# Patient Record
Sex: Male | Born: 1935 | Race: White | Hispanic: No | Marital: Single | Smoking: Never smoker
Health system: Southern US, Community
[De-identification: ages and names within clinical notes are randomized; demographics above are authoritative.]

## PROBLEM LIST (undated history)

## (undated) DIAGNOSIS — M069 Rheumatoid arthritis, unspecified: Secondary | ICD-10-CM

## (undated) DIAGNOSIS — J449 Chronic obstructive pulmonary disease, unspecified: Secondary | ICD-10-CM

---

## 2004-03-23 ENCOUNTER — Other Ambulatory Visit: Payer: Self-pay

## 2004-03-23 ENCOUNTER — Inpatient Hospital Stay: Payer: Self-pay | Admitting: Internal Medicine

## 2004-11-30 ENCOUNTER — Ambulatory Visit: Payer: Self-pay

## 2005-04-22 ENCOUNTER — Other Ambulatory Visit: Payer: Self-pay

## 2005-04-22 ENCOUNTER — Emergency Department: Payer: Self-pay | Admitting: Emergency Medicine

## 2005-07-12 ENCOUNTER — Ambulatory Visit: Payer: Self-pay | Admitting: Internal Medicine

## 2005-09-08 ENCOUNTER — Inpatient Hospital Stay: Payer: Self-pay | Admitting: Internal Medicine

## 2005-09-08 ENCOUNTER — Other Ambulatory Visit: Payer: Self-pay

## 2005-09-15 ENCOUNTER — Ambulatory Visit: Payer: Self-pay | Admitting: Specialist

## 2005-10-31 ENCOUNTER — Ambulatory Visit: Payer: Self-pay | Admitting: Rheumatology

## 2005-11-01 ENCOUNTER — Ambulatory Visit: Payer: Self-pay | Admitting: Specialist

## 2005-11-03 ENCOUNTER — Other Ambulatory Visit: Payer: Self-pay

## 2005-11-04 ENCOUNTER — Inpatient Hospital Stay: Payer: Self-pay | Admitting: Internal Medicine

## 2005-11-14 ENCOUNTER — Inpatient Hospital Stay: Payer: Self-pay | Admitting: Internal Medicine

## 2006-01-04 ENCOUNTER — Ambulatory Visit: Payer: Self-pay | Admitting: Rheumatology

## 2006-04-20 ENCOUNTER — Ambulatory Visit: Payer: Self-pay | Admitting: Specialist

## 2006-10-04 ENCOUNTER — Ambulatory Visit: Payer: Self-pay | Admitting: Specialist

## 2006-10-31 ENCOUNTER — Ambulatory Visit: Payer: Self-pay

## 2006-12-14 ENCOUNTER — Other Ambulatory Visit: Payer: Self-pay

## 2006-12-14 ENCOUNTER — Ambulatory Visit: Payer: Self-pay | Admitting: General Practice

## 2006-12-19 ENCOUNTER — Ambulatory Visit: Payer: Self-pay | Admitting: General Practice

## 2007-03-20 IMAGING — CT CT CHEST W/ CM
1 series · 15 of 32 positions shown, 19 images · IV contrast (APPLIED)
Comparison: none

REASON FOR EXAM: Shortness of breath, hypoxia, tachycardia, elevated
D-Dimer
COMMENTS:

[Series 4: soft tissue · axial · 0.78mm/px · z∈[-692,-410]mm · 15 of 106 slices shown, 19 images]
[im 8/106  mediastinal]
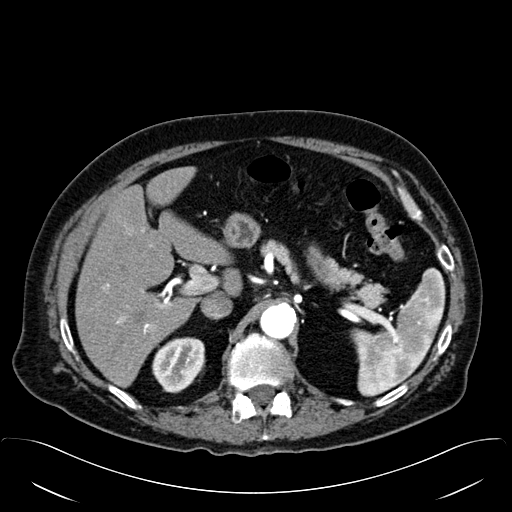
[im 8/106  lung]
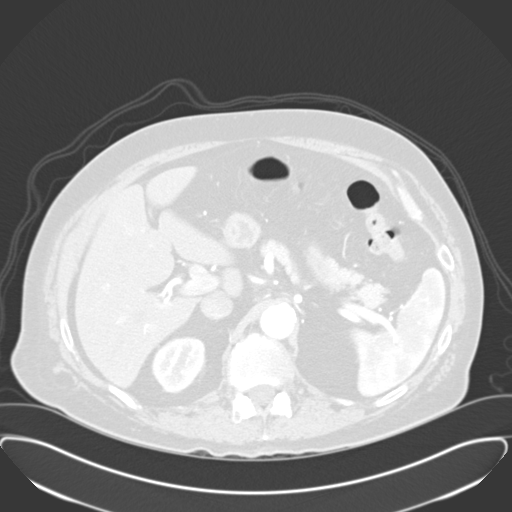
[im 16/106  lung]
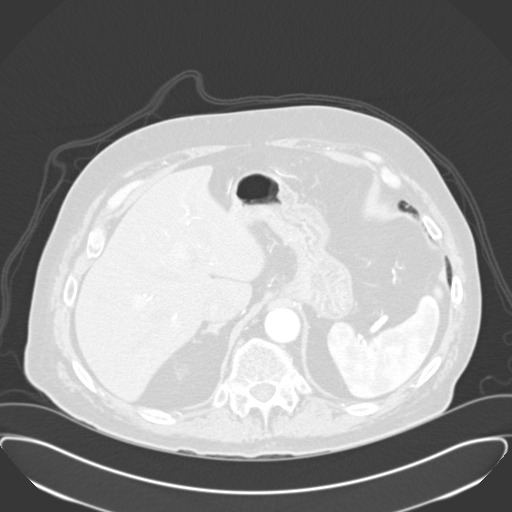
[im 22/106  lung]
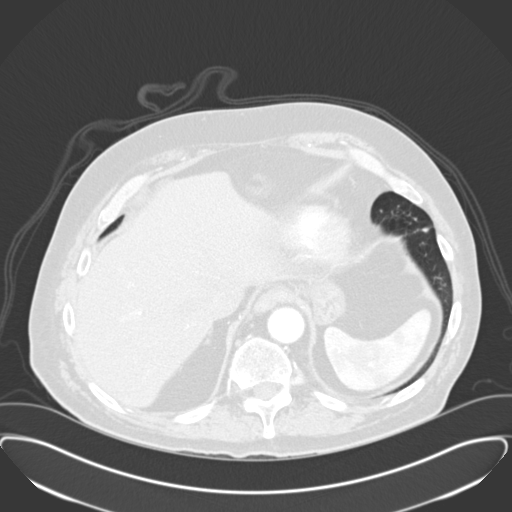
[im 28/106  lung]
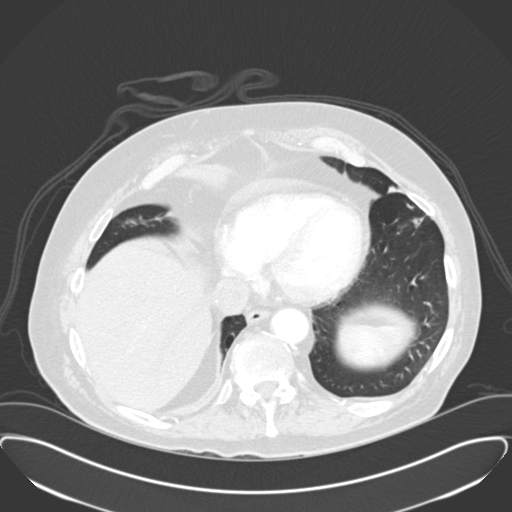
[im 36/106  mediastinal]
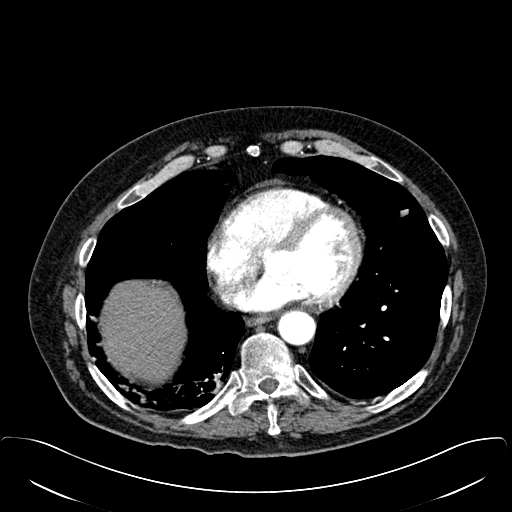
[im 36/106  lung]
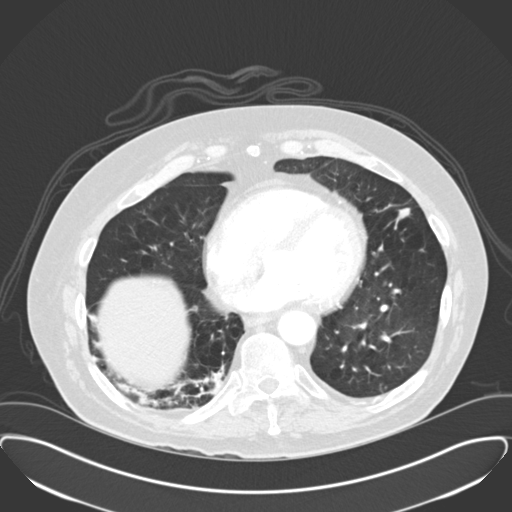
[im 43/106  lung]
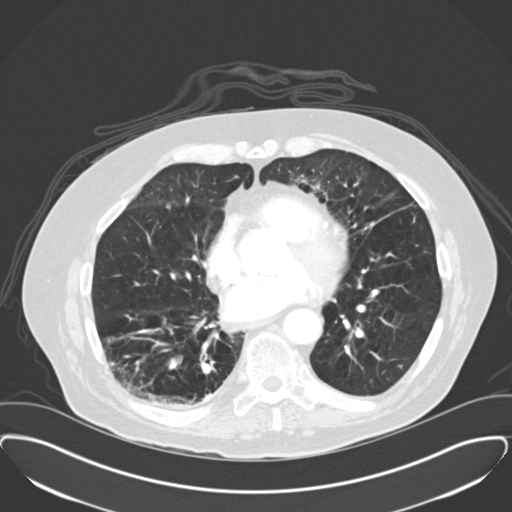
[im 51/106  lung]
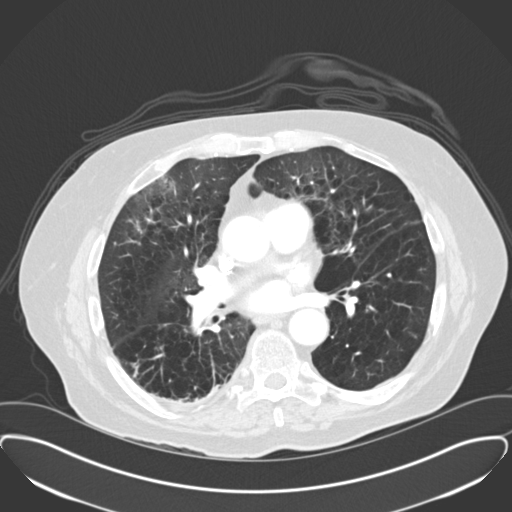
[im 56/106  lung]
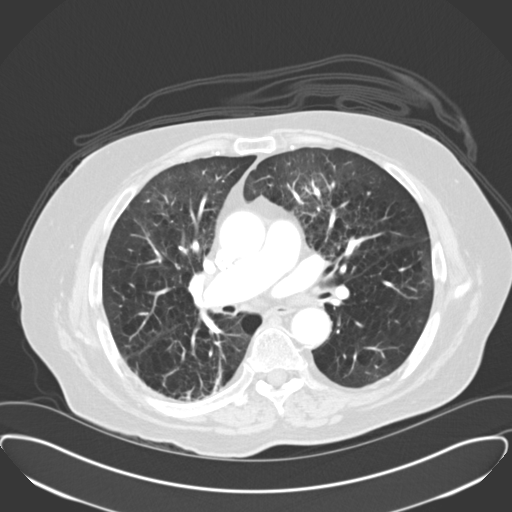
[im 63/106  mediastinal]
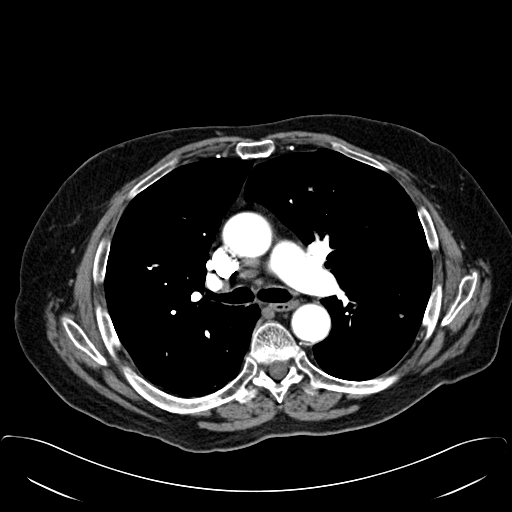
[im 63/106  lung]
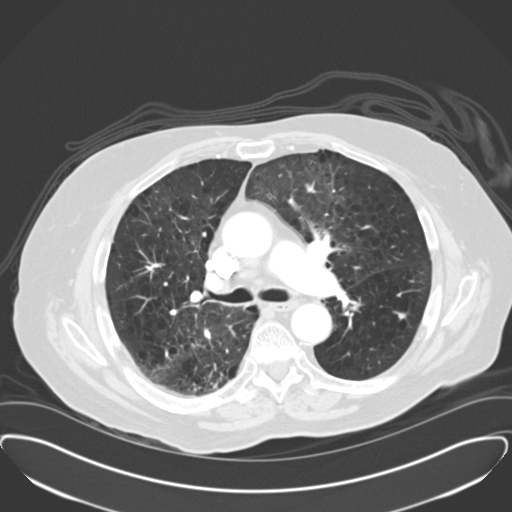
[im 67/106  lung]
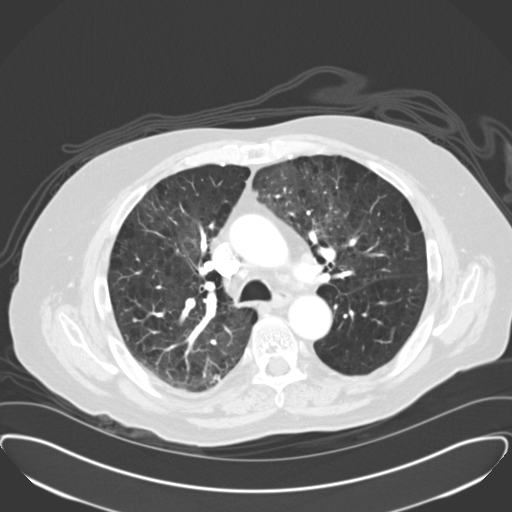
[im 74/106  lung]
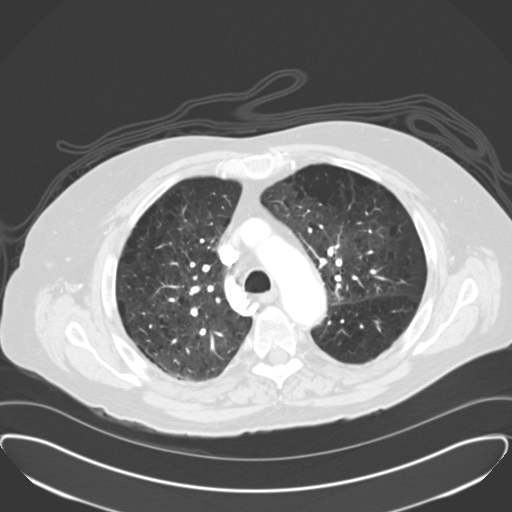
[im 82/106  lung]
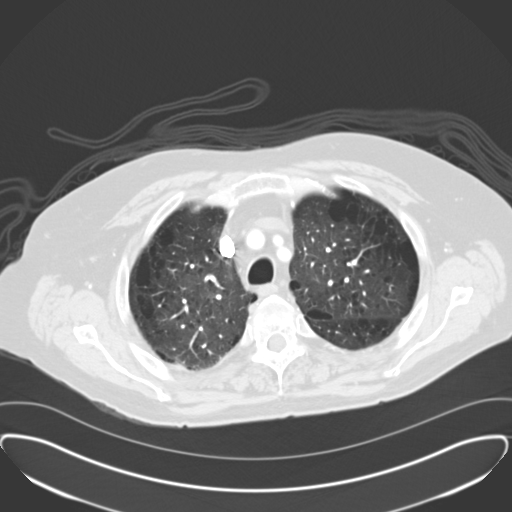
[im 86/106  mediastinal]
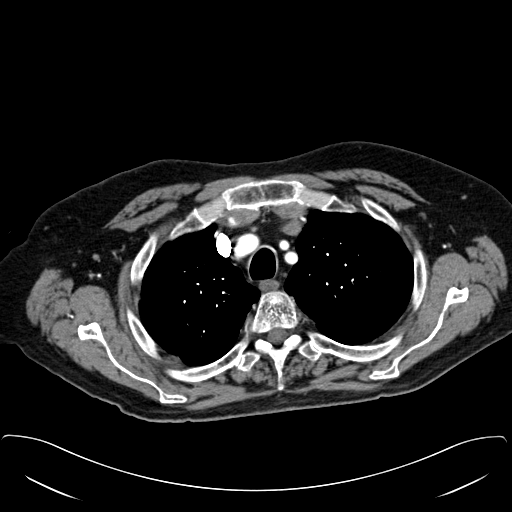
[im 86/106  lung]
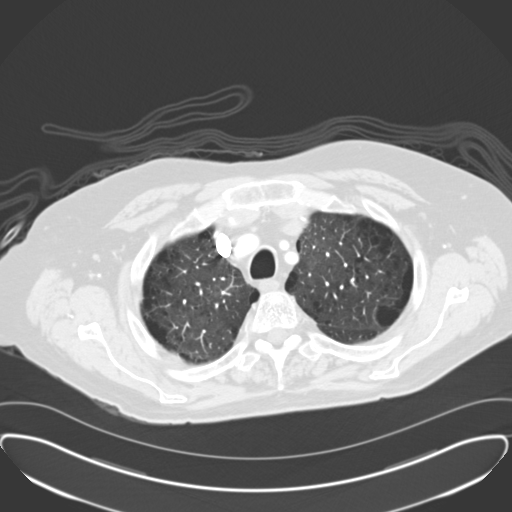
[im 94/106  lung]
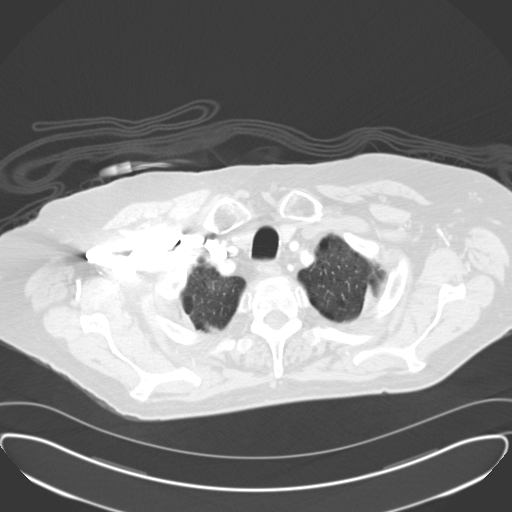
[im 102/106  lung]
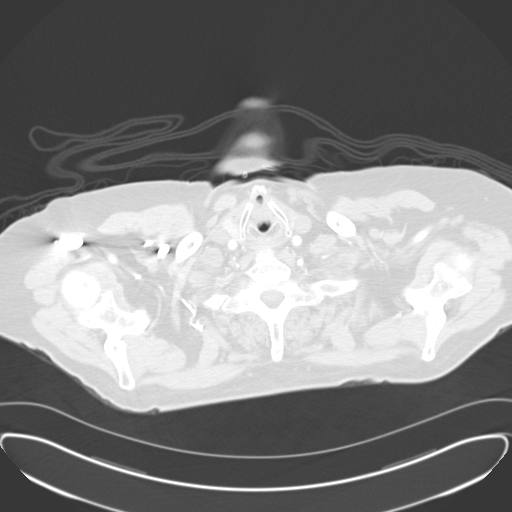

[15 of 32 positions shown; findings below may reference images not displayed]

PROCEDURE:     CT  - CT CHEST (FOR PE) W  - November 15, 2005  [DATE]

RESULT:     The patient experienced hypoxia and tachycardia.

The study was tailored to evaluate the patient for acute pulmonary embolism.

The patient received 100 cc's of Msovue-LIR for this study.

Contrast within the pulmonary arterial tree is normal. I do not see evidence
of an acute pulmonary embolism. No significant pleural effusion is seen. A
tiny pericardial effusion is present. The caliber of the thoracic aorta is
normal. I see no pathologic sized mediastinal or hilar lymph nodes. At lung
window settings, there are emphysematous changes bilaterally. Increased
interstitial density is noted in the inferior aspect of the LEFT upper lobe
as well as in the RIGHT middle lobe and inferior aspect of the RIGHT upper
lobe. Nodularity is present in the superior segment of the RIGHT lower lobe
and in the superior segment of the LEFT lower lobe. These areas of
nodularity measure at or just under 1.0 cm in diameter. Confluent density in
the posterior costophrenic gutter on the RIGHT is present. When compared to
the prior chest CT of 11/01/2005, the nodularity in the upper lobes is
stable. The nodule on the RIGHT is calcified. The nodule on the LEFT does
not appear calcified. The interstitial density spoken of in the RIGHT middle
and upper lobes and LEFT upper lobe do appear new.

Within the upper abdomen, the observed portions of the liver are normal. I
see no adrenal mass though the LEFT adrenal is not completely included in
the field of view.
IMPRESSION: 1.     I do not see evidence of acute pulmonary embolism.
2.     Mildly increased interstitial density is noted in the upper and
middle lobes on the RIGHT and in the upper lobe on the LEFT. This reflects a
change from the prior study.
3.     There are findings consistent with COPD. There is evidence of old
granulomatous disease. Some subsegmental atelectasis in the posterior
costophrenic gutter on the RIGHT has become more conspicuous.
4.     I see no overt evidence of CHF. There is a small, pericardial
effusion.

Dr. Ofe was paged with this report at approximately [DATE] on 11/15/2005.

## 2007-03-21 IMAGING — CR DG CHEST 2V
1 series · 3 of 3 positions shown · non-contrast
Comparison: none

REASON FOR EXAM: VILAR RODRIGUES
COMMENTS:

[Series 1: view not recorded · 0.17mm/px · 3 of 3 slices shown]
[im 1/3]
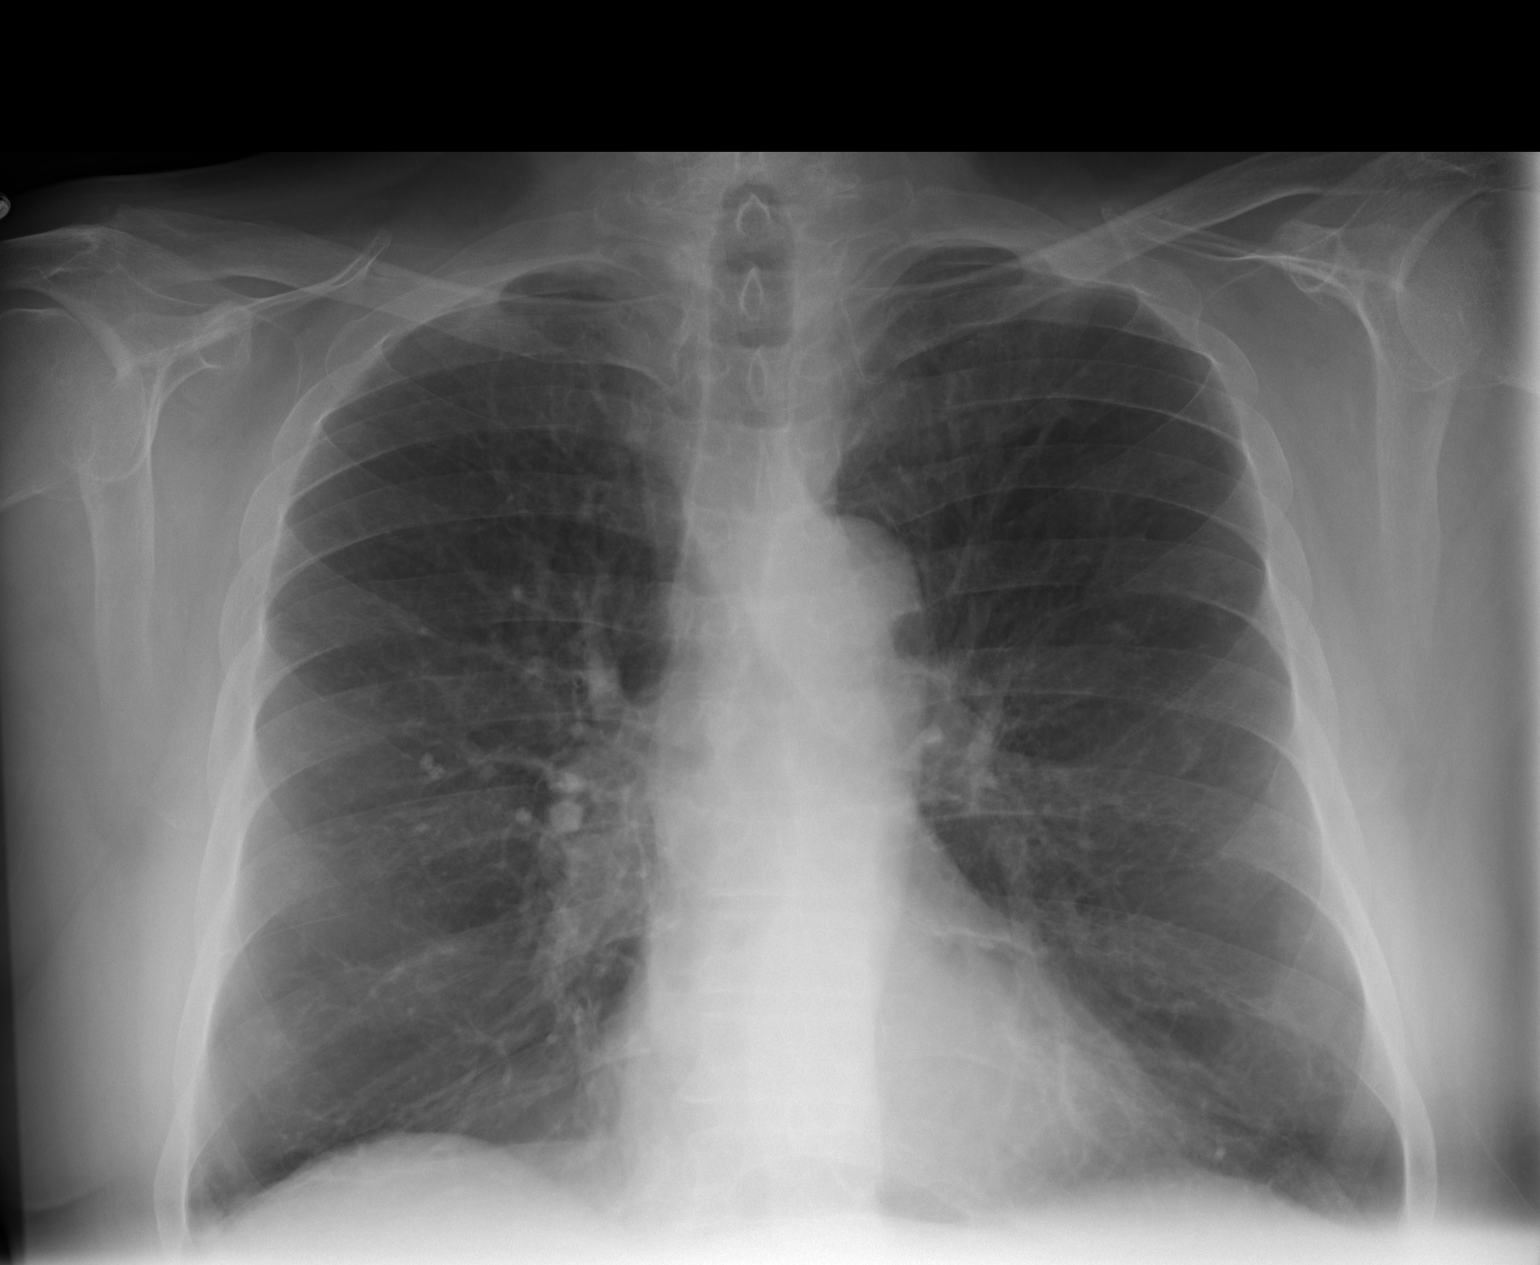
[im 2/3]
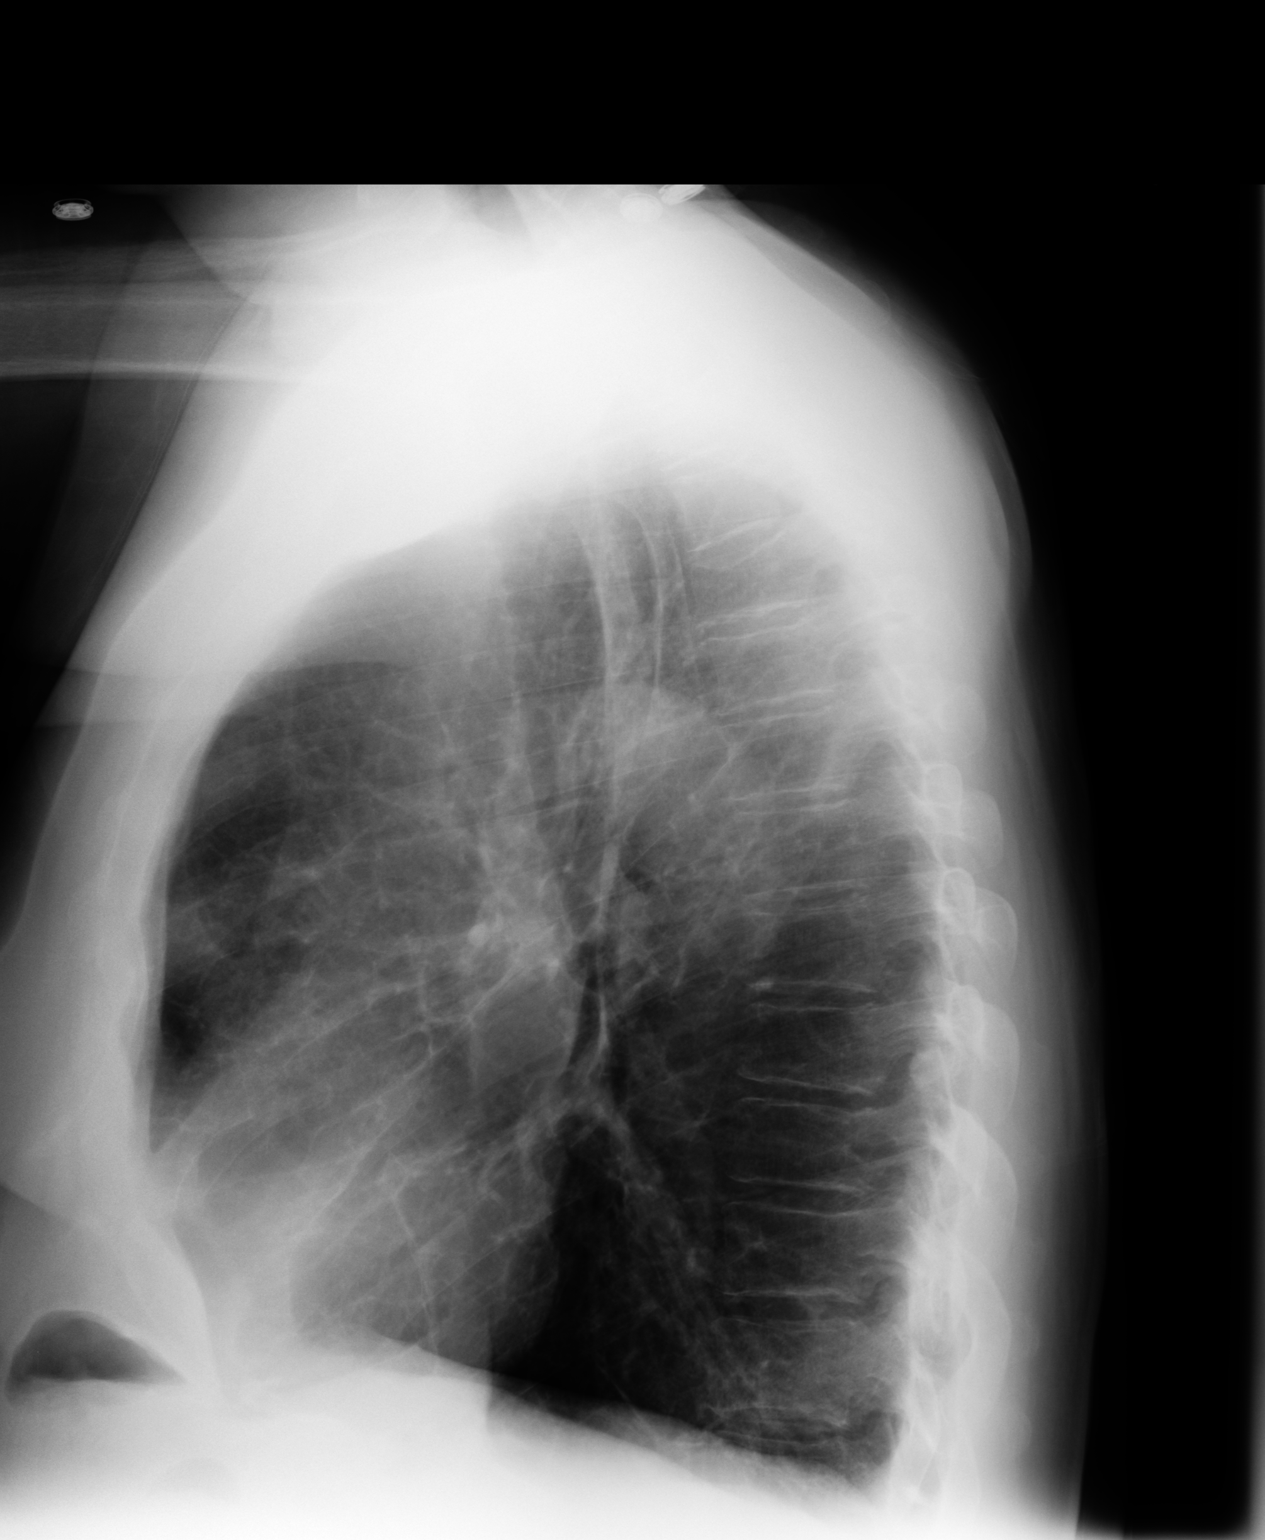
[im 3/3]
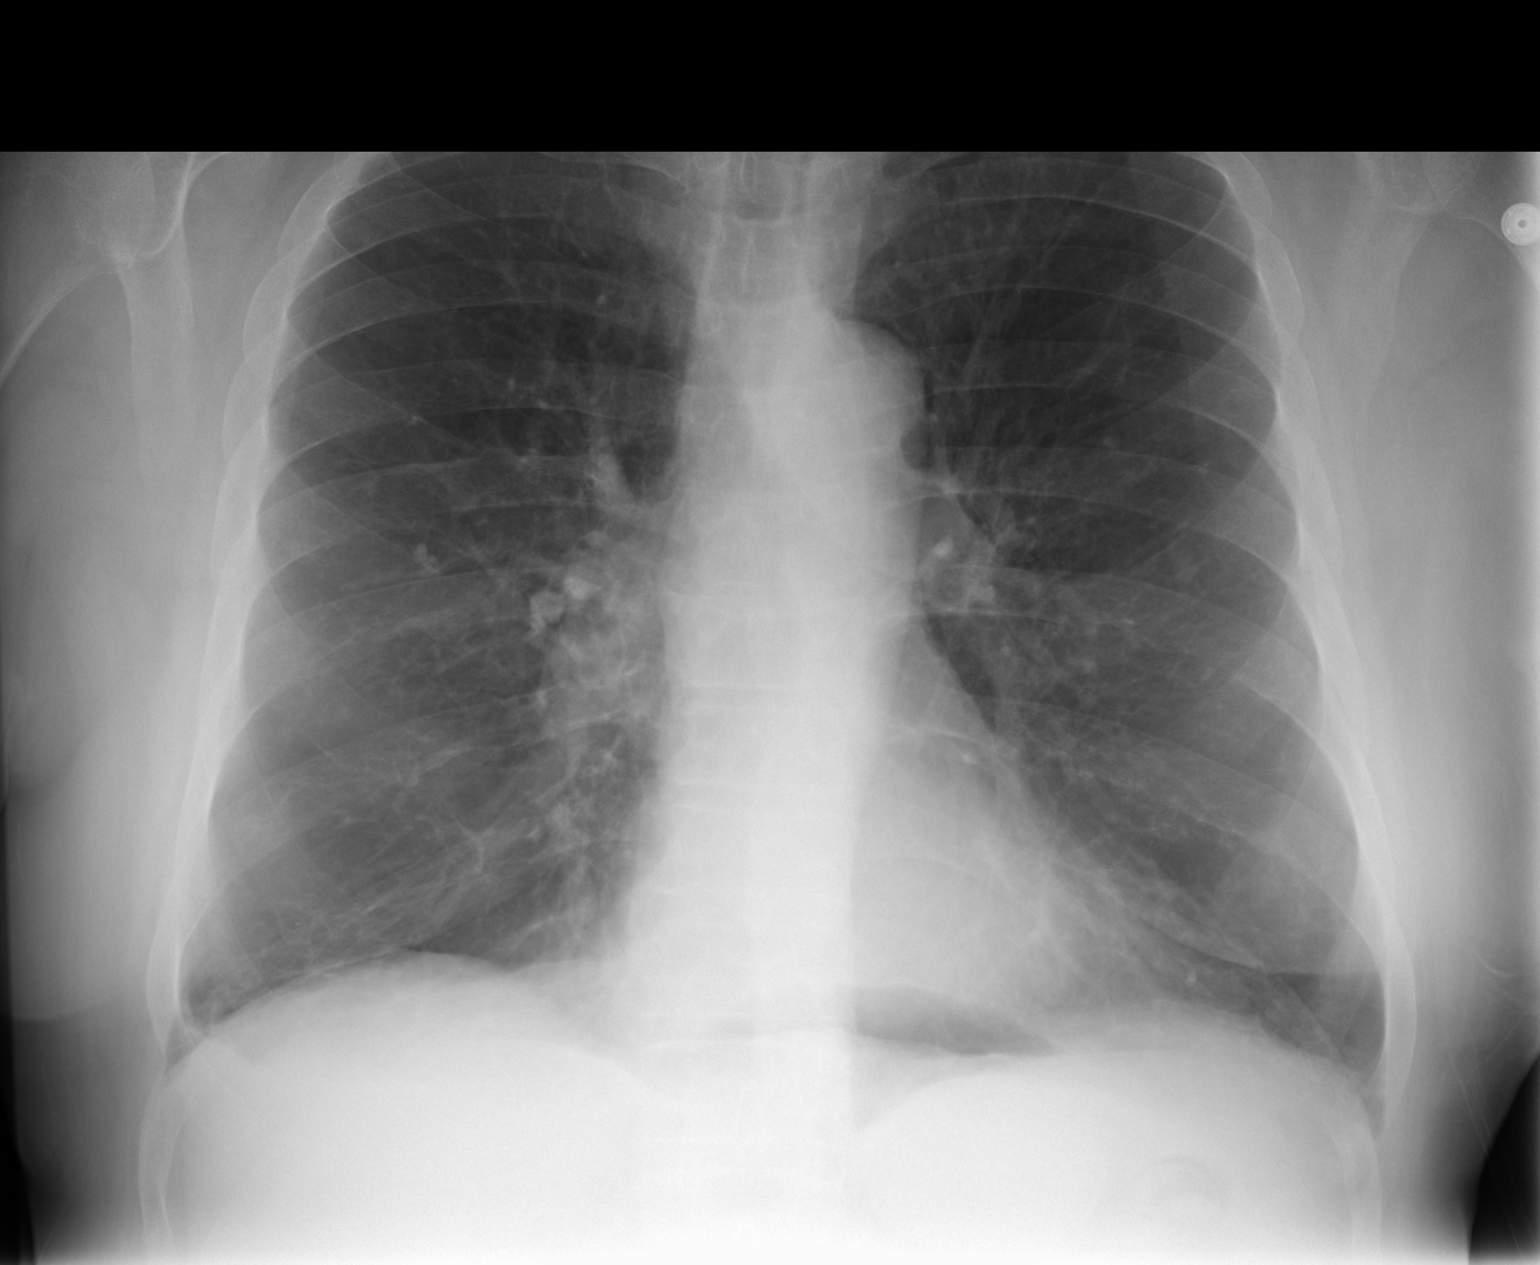

[3 of 3 positions shown; findings below may reference images not displayed]

PROCEDURE:     DXR - DXR CHEST PA (OR AP) AND LATERAL  - November 16, 2005  [DATE]

RESULT:     The current exam is compared to prior exam of 11-03-05.  The lung
fields are clear of infiltrate. No pneumonia, pneumothorax or pleural
effusion is seen. Calcified granulomas are again noted in the RIGHT mid lung
field. The chest is hyperexpanded compatible with COPD. Heart size is
normal.
IMPRESSION: 1)The lung fields are clear.

2)The chest is hyperexpanded compatible with a history of COPD or asthma.

## 2007-08-18 ENCOUNTER — Other Ambulatory Visit: Payer: Self-pay

## 2007-08-18 ENCOUNTER — Inpatient Hospital Stay: Payer: Self-pay | Admitting: Urology

## 2007-08-29 ENCOUNTER — Ambulatory Visit: Payer: Self-pay | Admitting: Urology

## 2007-09-05 ENCOUNTER — Ambulatory Visit: Payer: Self-pay | Admitting: Urology

## 2007-09-17 ENCOUNTER — Ambulatory Visit: Payer: Self-pay | Admitting: Urology

## 2007-09-27 ENCOUNTER — Ambulatory Visit: Payer: Self-pay | Admitting: Urology

## 2007-11-01 ENCOUNTER — Ambulatory Visit: Payer: Self-pay | Admitting: Urology

## 2007-12-11 ENCOUNTER — Ambulatory Visit: Payer: Self-pay | Admitting: Rheumatology

## 2008-11-03 ENCOUNTER — Ambulatory Visit: Payer: Self-pay | Admitting: Urology

## 2008-12-07 ENCOUNTER — Ambulatory Visit: Payer: Self-pay | Admitting: Urology

## 2008-12-31 IMAGING — CR DG ABDOMEN 1V
1 series · 1 of 1 positions shown · non-contrast
Comparison: none

REASON FOR EXAM: Nephrolithiasis
COMMENTS:

[view not recorded]
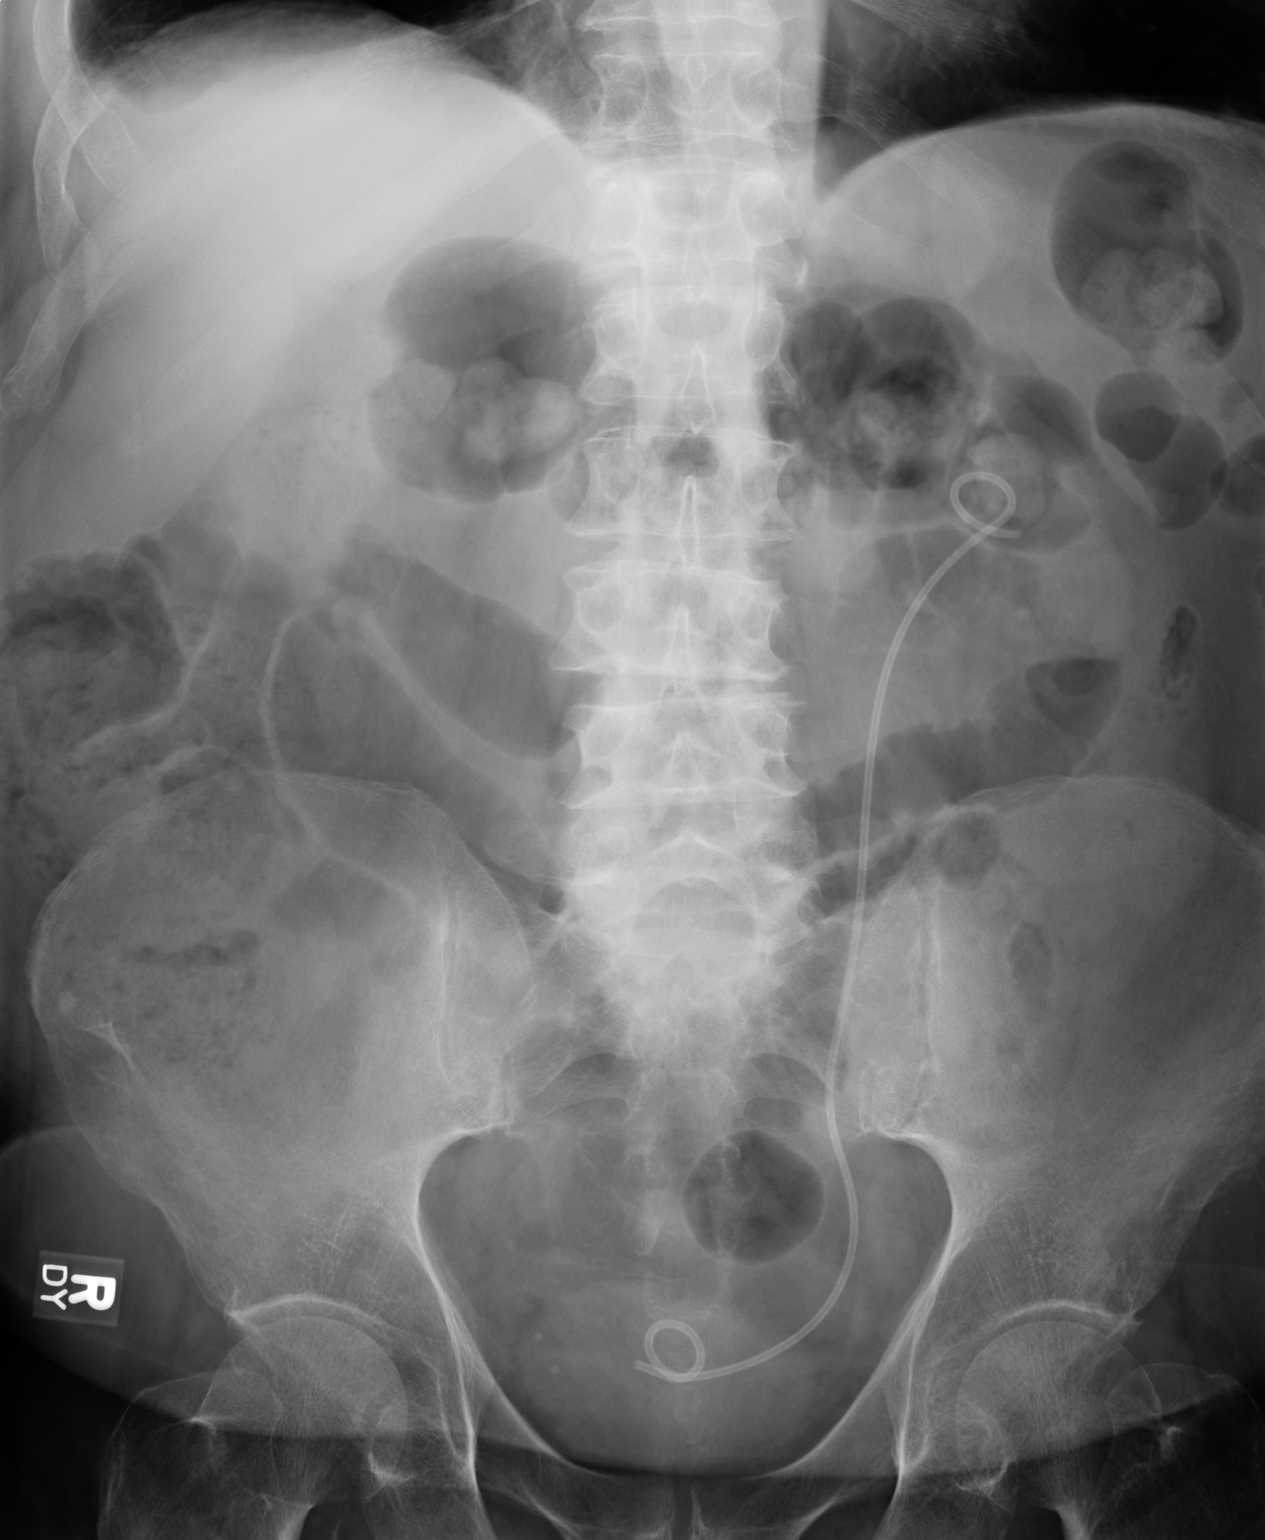

[1 of 1 positions shown; findings below may reference images not displayed]

PROCEDURE:     DXR - DXR KIDNEY URETER BLADDER  - August 29, 2007  [DATE]

RESULT:     A LEFT ureteral stent is present. No definite stone along the
course of the stent is seen. There is a 4.0 mm calcification projected over
the midpole region of the LEFT kidney. The finding is suspicious for a LEFT
midpole renal caliceal stone and could possibly represent the stone
previously noted in the LEFT ureteropelvic region at prior CT. No RIGHT
renal or RIGHT ureteral stones are seen.
IMPRESSION: 1.  Possible LEFT renal stone as noted above.
2.  A LEFT ureteral stone is present.

## 2011-09-04 ENCOUNTER — Ambulatory Visit: Payer: Self-pay | Admitting: Rheumatology

## 2014-06-10 DIAGNOSIS — M0579 Rheumatoid arthritis with rheumatoid factor of multiple sites without organ or systems involvement: Secondary | ICD-10-CM | POA: Diagnosis not present

## 2014-06-10 DIAGNOSIS — Z79899 Other long term (current) drug therapy: Secondary | ICD-10-CM | POA: Diagnosis not present

## 2014-06-16 DIAGNOSIS — J449 Chronic obstructive pulmonary disease, unspecified: Secondary | ICD-10-CM | POA: Diagnosis not present

## 2014-06-18 DIAGNOSIS — M0579 Rheumatoid arthritis with rheumatoid factor of multiple sites without organ or systems involvement: Secondary | ICD-10-CM | POA: Diagnosis not present

## 2014-07-02 DIAGNOSIS — R0602 Shortness of breath: Secondary | ICD-10-CM | POA: Diagnosis not present

## 2014-07-02 DIAGNOSIS — M79641 Pain in right hand: Secondary | ICD-10-CM | POA: Diagnosis not present

## 2014-07-02 DIAGNOSIS — J449 Chronic obstructive pulmonary disease, unspecified: Secondary | ICD-10-CM | POA: Diagnosis not present

## 2014-07-17 DIAGNOSIS — J449 Chronic obstructive pulmonary disease, unspecified: Secondary | ICD-10-CM | POA: Diagnosis not present

## 2014-08-15 DIAGNOSIS — J449 Chronic obstructive pulmonary disease, unspecified: Secondary | ICD-10-CM | POA: Diagnosis not present

## 2014-08-27 DIAGNOSIS — H4011X3 Primary open-angle glaucoma, severe stage: Secondary | ICD-10-CM | POA: Diagnosis not present

## 2014-08-27 DIAGNOSIS — H524 Presbyopia: Secondary | ICD-10-CM | POA: Diagnosis not present

## 2014-08-27 DIAGNOSIS — H2510 Age-related nuclear cataract, unspecified eye: Secondary | ICD-10-CM | POA: Diagnosis not present

## 2014-09-15 DIAGNOSIS — J449 Chronic obstructive pulmonary disease, unspecified: Secondary | ICD-10-CM | POA: Diagnosis not present

## 2014-10-15 DIAGNOSIS — J449 Chronic obstructive pulmonary disease, unspecified: Secondary | ICD-10-CM | POA: Diagnosis not present

## 2014-10-29 DIAGNOSIS — R0609 Other forms of dyspnea: Secondary | ICD-10-CM | POA: Diagnosis not present

## 2014-10-29 DIAGNOSIS — J439 Emphysema, unspecified: Secondary | ICD-10-CM | POA: Diagnosis not present

## 2014-11-15 DIAGNOSIS — J449 Chronic obstructive pulmonary disease, unspecified: Secondary | ICD-10-CM | POA: Diagnosis not present

## 2014-12-01 DIAGNOSIS — M818 Other osteoporosis without current pathological fracture: Secondary | ICD-10-CM | POA: Diagnosis not present

## 2014-12-01 DIAGNOSIS — M069 Rheumatoid arthritis, unspecified: Secondary | ICD-10-CM | POA: Diagnosis not present

## 2014-12-01 DIAGNOSIS — J449 Chronic obstructive pulmonary disease, unspecified: Secondary | ICD-10-CM | POA: Diagnosis not present

## 2014-12-10 DIAGNOSIS — M0579 Rheumatoid arthritis with rheumatoid factor of multiple sites without organ or systems involvement: Secondary | ICD-10-CM | POA: Diagnosis not present

## 2014-12-17 DIAGNOSIS — M0579 Rheumatoid arthritis with rheumatoid factor of multiple sites without organ or systems involvement: Secondary | ICD-10-CM | POA: Diagnosis not present

## 2014-12-17 DIAGNOSIS — M25511 Pain in right shoulder: Secondary | ICD-10-CM | POA: Diagnosis not present

## 2014-12-17 DIAGNOSIS — M810 Age-related osteoporosis without current pathological fracture: Secondary | ICD-10-CM | POA: Diagnosis not present

## 2015-01-01 DIAGNOSIS — J449 Chronic obstructive pulmonary disease, unspecified: Secondary | ICD-10-CM | POA: Diagnosis not present

## 2015-01-01 DIAGNOSIS — M818 Other osteoporosis without current pathological fracture: Secondary | ICD-10-CM | POA: Diagnosis not present

## 2015-01-01 DIAGNOSIS — M069 Rheumatoid arthritis, unspecified: Secondary | ICD-10-CM | POA: Diagnosis not present

## 2015-01-28 DIAGNOSIS — H4011X3 Primary open-angle glaucoma, severe stage: Secondary | ICD-10-CM | POA: Diagnosis not present

## 2015-02-01 DIAGNOSIS — J449 Chronic obstructive pulmonary disease, unspecified: Secondary | ICD-10-CM | POA: Diagnosis not present

## 2015-02-01 DIAGNOSIS — M069 Rheumatoid arthritis, unspecified: Secondary | ICD-10-CM | POA: Diagnosis not present

## 2015-02-01 DIAGNOSIS — M818 Other osteoporosis without current pathological fracture: Secondary | ICD-10-CM | POA: Diagnosis not present

## 2015-02-16 DIAGNOSIS — M25561 Pain in right knee: Secondary | ICD-10-CM | POA: Diagnosis not present

## 2015-02-16 DIAGNOSIS — G8929 Other chronic pain: Secondary | ICD-10-CM | POA: Diagnosis not present

## 2015-02-16 DIAGNOSIS — M0579 Rheumatoid arthritis with rheumatoid factor of multiple sites without organ or systems involvement: Secondary | ICD-10-CM | POA: Diagnosis not present

## 2015-03-03 DIAGNOSIS — J449 Chronic obstructive pulmonary disease, unspecified: Secondary | ICD-10-CM | POA: Diagnosis not present

## 2015-03-03 DIAGNOSIS — M069 Rheumatoid arthritis, unspecified: Secondary | ICD-10-CM | POA: Diagnosis not present

## 2015-03-03 DIAGNOSIS — M818 Other osteoporosis without current pathological fracture: Secondary | ICD-10-CM | POA: Diagnosis not present

## 2015-03-12 DIAGNOSIS — J432 Centrilobular emphysema: Secondary | ICD-10-CM | POA: Diagnosis not present

## 2015-03-17 DIAGNOSIS — J449 Chronic obstructive pulmonary disease, unspecified: Secondary | ICD-10-CM | POA: Diagnosis not present

## 2015-03-17 DIAGNOSIS — G8929 Other chronic pain: Secondary | ICD-10-CM | POA: Diagnosis not present

## 2015-03-17 DIAGNOSIS — M25511 Pain in right shoulder: Secondary | ICD-10-CM | POA: Diagnosis not present

## 2015-03-17 DIAGNOSIS — M810 Age-related osteoporosis without current pathological fracture: Secondary | ICD-10-CM | POA: Diagnosis not present

## 2015-03-17 DIAGNOSIS — M0579 Rheumatoid arthritis with rheumatoid factor of multiple sites without organ or systems involvement: Secondary | ICD-10-CM | POA: Diagnosis not present

## 2015-04-03 DIAGNOSIS — M818 Other osteoporosis without current pathological fracture: Secondary | ICD-10-CM | POA: Diagnosis not present

## 2015-04-03 DIAGNOSIS — J449 Chronic obstructive pulmonary disease, unspecified: Secondary | ICD-10-CM | POA: Diagnosis not present

## 2015-04-03 DIAGNOSIS — M069 Rheumatoid arthritis, unspecified: Secondary | ICD-10-CM | POA: Diagnosis not present

## 2015-05-03 DIAGNOSIS — M069 Rheumatoid arthritis, unspecified: Secondary | ICD-10-CM | POA: Diagnosis not present

## 2015-05-03 DIAGNOSIS — M818 Other osteoporosis without current pathological fracture: Secondary | ICD-10-CM | POA: Diagnosis not present

## 2015-05-03 DIAGNOSIS — J449 Chronic obstructive pulmonary disease, unspecified: Secondary | ICD-10-CM | POA: Diagnosis not present

## 2015-06-03 DIAGNOSIS — M818 Other osteoporosis without current pathological fracture: Secondary | ICD-10-CM | POA: Diagnosis not present

## 2015-06-03 DIAGNOSIS — J449 Chronic obstructive pulmonary disease, unspecified: Secondary | ICD-10-CM | POA: Diagnosis not present

## 2015-06-03 DIAGNOSIS — M069 Rheumatoid arthritis, unspecified: Secondary | ICD-10-CM | POA: Diagnosis not present

## 2015-06-10 DIAGNOSIS — H401133 Primary open-angle glaucoma, bilateral, severe stage: Secondary | ICD-10-CM | POA: Diagnosis not present

## 2015-07-04 DIAGNOSIS — M069 Rheumatoid arthritis, unspecified: Secondary | ICD-10-CM | POA: Diagnosis not present

## 2015-07-04 DIAGNOSIS — J449 Chronic obstructive pulmonary disease, unspecified: Secondary | ICD-10-CM | POA: Diagnosis not present

## 2015-07-04 DIAGNOSIS — M818 Other osteoporosis without current pathological fracture: Secondary | ICD-10-CM | POA: Diagnosis not present

## 2015-07-20 DIAGNOSIS — M810 Age-related osteoporosis without current pathological fracture: Secondary | ICD-10-CM | POA: Diagnosis not present

## 2015-07-20 DIAGNOSIS — J449 Chronic obstructive pulmonary disease, unspecified: Secondary | ICD-10-CM | POA: Diagnosis not present

## 2015-07-20 DIAGNOSIS — M0579 Rheumatoid arthritis with rheumatoid factor of multiple sites without organ or systems involvement: Secondary | ICD-10-CM | POA: Diagnosis not present

## 2015-07-20 DIAGNOSIS — M542 Cervicalgia: Secondary | ICD-10-CM | POA: Diagnosis not present

## 2015-07-22 DIAGNOSIS — J439 Emphysema, unspecified: Secondary | ICD-10-CM | POA: Diagnosis not present

## 2015-07-22 DIAGNOSIS — R05 Cough: Secondary | ICD-10-CM | POA: Diagnosis not present

## 2015-08-01 DIAGNOSIS — M818 Other osteoporosis without current pathological fracture: Secondary | ICD-10-CM | POA: Diagnosis not present

## 2015-08-01 DIAGNOSIS — M069 Rheumatoid arthritis, unspecified: Secondary | ICD-10-CM | POA: Diagnosis not present

## 2015-08-01 DIAGNOSIS — J449 Chronic obstructive pulmonary disease, unspecified: Secondary | ICD-10-CM | POA: Diagnosis not present

## 2015-08-02 DIAGNOSIS — M0579 Rheumatoid arthritis with rheumatoid factor of multiple sites without organ or systems involvement: Secondary | ICD-10-CM | POA: Diagnosis not present

## 2015-08-02 DIAGNOSIS — M25562 Pain in left knee: Secondary | ICD-10-CM | POA: Diagnosis not present

## 2015-08-02 DIAGNOSIS — M810 Age-related osteoporosis without current pathological fracture: Secondary | ICD-10-CM | POA: Diagnosis not present

## 2015-09-01 DIAGNOSIS — M818 Other osteoporosis without current pathological fracture: Secondary | ICD-10-CM | POA: Diagnosis not present

## 2015-09-01 DIAGNOSIS — M069 Rheumatoid arthritis, unspecified: Secondary | ICD-10-CM | POA: Diagnosis not present

## 2015-09-01 DIAGNOSIS — J449 Chronic obstructive pulmonary disease, unspecified: Secondary | ICD-10-CM | POA: Diagnosis not present

## 2015-09-02 DIAGNOSIS — H524 Presbyopia: Secondary | ICD-10-CM | POA: Diagnosis not present

## 2015-09-02 DIAGNOSIS — H401133 Primary open-angle glaucoma, bilateral, severe stage: Secondary | ICD-10-CM | POA: Diagnosis not present

## 2015-09-02 DIAGNOSIS — H25813 Combined forms of age-related cataract, bilateral: Secondary | ICD-10-CM | POA: Diagnosis not present

## 2015-09-30 DIAGNOSIS — G8929 Other chronic pain: Secondary | ICD-10-CM | POA: Diagnosis not present

## 2015-09-30 DIAGNOSIS — M0579 Rheumatoid arthritis with rheumatoid factor of multiple sites without organ or systems involvement: Secondary | ICD-10-CM | POA: Diagnosis not present

## 2015-09-30 DIAGNOSIS — M25511 Pain in right shoulder: Secondary | ICD-10-CM | POA: Diagnosis not present

## 2015-09-30 DIAGNOSIS — M25552 Pain in left hip: Secondary | ICD-10-CM | POA: Diagnosis not present

## 2015-10-01 DIAGNOSIS — J449 Chronic obstructive pulmonary disease, unspecified: Secondary | ICD-10-CM | POA: Diagnosis not present

## 2015-10-01 DIAGNOSIS — M069 Rheumatoid arthritis, unspecified: Secondary | ICD-10-CM | POA: Diagnosis not present

## 2015-10-01 DIAGNOSIS — M818 Other osteoporosis without current pathological fracture: Secondary | ICD-10-CM | POA: Diagnosis not present

## 2015-10-20 DIAGNOSIS — M0579 Rheumatoid arthritis with rheumatoid factor of multiple sites without organ or systems involvement: Secondary | ICD-10-CM | POA: Diagnosis not present

## 2015-10-20 DIAGNOSIS — M25552 Pain in left hip: Secondary | ICD-10-CM | POA: Diagnosis not present

## 2015-10-28 DIAGNOSIS — M1712 Unilateral primary osteoarthritis, left knee: Secondary | ICD-10-CM | POA: Diagnosis not present

## 2015-11-01 DIAGNOSIS — M069 Rheumatoid arthritis, unspecified: Secondary | ICD-10-CM | POA: Diagnosis not present

## 2015-11-01 DIAGNOSIS — M818 Other osteoporosis without current pathological fracture: Secondary | ICD-10-CM | POA: Diagnosis not present

## 2015-11-01 DIAGNOSIS — J449 Chronic obstructive pulmonary disease, unspecified: Secondary | ICD-10-CM | POA: Diagnosis not present

## 2015-11-26 DIAGNOSIS — M25561 Pain in right knee: Secondary | ICD-10-CM | POA: Diagnosis not present

## 2015-11-26 DIAGNOSIS — M0579 Rheumatoid arthritis with rheumatoid factor of multiple sites without organ or systems involvement: Secondary | ICD-10-CM | POA: Diagnosis not present

## 2015-12-01 DIAGNOSIS — M818 Other osteoporosis without current pathological fracture: Secondary | ICD-10-CM | POA: Diagnosis not present

## 2015-12-01 DIAGNOSIS — M069 Rheumatoid arthritis, unspecified: Secondary | ICD-10-CM | POA: Diagnosis not present

## 2015-12-01 DIAGNOSIS — J449 Chronic obstructive pulmonary disease, unspecified: Secondary | ICD-10-CM | POA: Diagnosis not present

## 2016-01-01 DIAGNOSIS — J449 Chronic obstructive pulmonary disease, unspecified: Secondary | ICD-10-CM | POA: Diagnosis not present

## 2016-01-01 DIAGNOSIS — M818 Other osteoporosis without current pathological fracture: Secondary | ICD-10-CM | POA: Diagnosis not present

## 2016-01-01 DIAGNOSIS — M069 Rheumatoid arthritis, unspecified: Secondary | ICD-10-CM | POA: Diagnosis not present

## 2016-01-05 DIAGNOSIS — R0609 Other forms of dyspnea: Secondary | ICD-10-CM | POA: Diagnosis not present

## 2016-01-05 DIAGNOSIS — J439 Emphysema, unspecified: Secondary | ICD-10-CM | POA: Diagnosis not present

## 2016-02-01 DIAGNOSIS — M818 Other osteoporosis without current pathological fracture: Secondary | ICD-10-CM | POA: Diagnosis not present

## 2016-02-01 DIAGNOSIS — M069 Rheumatoid arthritis, unspecified: Secondary | ICD-10-CM | POA: Diagnosis not present

## 2016-02-01 DIAGNOSIS — J449 Chronic obstructive pulmonary disease, unspecified: Secondary | ICD-10-CM | POA: Diagnosis not present

## 2016-02-17 DIAGNOSIS — H401133 Primary open-angle glaucoma, bilateral, severe stage: Secondary | ICD-10-CM | POA: Diagnosis not present

## 2016-02-17 DIAGNOSIS — H25813 Combined forms of age-related cataract, bilateral: Secondary | ICD-10-CM | POA: Diagnosis not present

## 2016-03-02 DIAGNOSIS — M818 Other osteoporosis without current pathological fracture: Secondary | ICD-10-CM | POA: Diagnosis not present

## 2016-03-02 DIAGNOSIS — M069 Rheumatoid arthritis, unspecified: Secondary | ICD-10-CM | POA: Diagnosis not present

## 2016-03-02 DIAGNOSIS — J449 Chronic obstructive pulmonary disease, unspecified: Secondary | ICD-10-CM | POA: Diagnosis not present

## 2016-04-02 DIAGNOSIS — M069 Rheumatoid arthritis, unspecified: Secondary | ICD-10-CM | POA: Diagnosis not present

## 2016-04-02 DIAGNOSIS — M818 Other osteoporosis without current pathological fracture: Secondary | ICD-10-CM | POA: Diagnosis not present

## 2016-04-02 DIAGNOSIS — J449 Chronic obstructive pulmonary disease, unspecified: Secondary | ICD-10-CM | POA: Diagnosis not present

## 2017-07-03 DIAGNOSIS — F1120 Opioid dependence, uncomplicated: Secondary | ICD-10-CM | POA: Diagnosis not present

## 2017-07-03 DIAGNOSIS — R5381 Other malaise: Secondary | ICD-10-CM | POA: Diagnosis not present

## 2017-07-03 DIAGNOSIS — I10 Essential (primary) hypertension: Secondary | ICD-10-CM | POA: Diagnosis not present

## 2017-07-03 DIAGNOSIS — M1991 Primary osteoarthritis, unspecified site: Secondary | ICD-10-CM | POA: Diagnosis not present

## 2017-07-03 DIAGNOSIS — R5383 Other fatigue: Secondary | ICD-10-CM | POA: Diagnosis not present

## 2017-07-03 DIAGNOSIS — J432 Centrilobular emphysema: Secondary | ICD-10-CM | POA: Diagnosis not present

## 2017-07-03 DIAGNOSIS — M0579 Rheumatoid arthritis with rheumatoid factor of multiple sites without organ or systems involvement: Secondary | ICD-10-CM | POA: Diagnosis not present

## 2017-07-03 DIAGNOSIS — D649 Anemia, unspecified: Secondary | ICD-10-CM | POA: Diagnosis not present

## 2017-07-19 DIAGNOSIS — R972 Elevated prostate specific antigen [PSA]: Secondary | ICD-10-CM | POA: Diagnosis not present

## 2017-07-19 DIAGNOSIS — M1991 Primary osteoarthritis, unspecified site: Secondary | ICD-10-CM | POA: Diagnosis not present

## 2017-07-19 DIAGNOSIS — M0579 Rheumatoid arthritis with rheumatoid factor of multiple sites without organ or systems involvement: Secondary | ICD-10-CM | POA: Diagnosis not present

## 2017-07-19 DIAGNOSIS — Z0000 Encounter for general adult medical examination without abnormal findings: Secondary | ICD-10-CM | POA: Diagnosis not present

## 2017-07-19 DIAGNOSIS — J432 Centrilobular emphysema: Secondary | ICD-10-CM | POA: Diagnosis not present

## 2017-07-19 DIAGNOSIS — D649 Anemia, unspecified: Secondary | ICD-10-CM | POA: Diagnosis not present

## 2017-07-19 DIAGNOSIS — R262 Difficulty in walking, not elsewhere classified: Secondary | ICD-10-CM | POA: Diagnosis not present

## 2017-09-07 ENCOUNTER — Other Ambulatory Visit: Payer: Self-pay | Admitting: Gastroenterology

## 2017-09-07 DIAGNOSIS — R195 Other fecal abnormalities: Secondary | ICD-10-CM

## 2017-09-07 DIAGNOSIS — D649 Anemia, unspecified: Secondary | ICD-10-CM

## 2017-09-14 ENCOUNTER — Ambulatory Visit
Admission: RE | Admit: 2017-09-14 | Discharge: 2017-09-14 | Disposition: A | Discharge status: Home / Self Care | Payer: Medicare Other | Source: Ambulatory Visit | Attending: Gastroenterology | Admitting: Gastroenterology

## 2017-09-14 DIAGNOSIS — R195 Other fecal abnormalities: Secondary | ICD-10-CM | POA: Insufficient documentation

## 2017-09-14 DIAGNOSIS — J431 Panlobular emphysema: Secondary | ICD-10-CM | POA: Insufficient documentation

## 2017-09-14 DIAGNOSIS — K389 Disease of appendix, unspecified: Secondary | ICD-10-CM | POA: Diagnosis not present

## 2017-09-14 DIAGNOSIS — K7689 Other specified diseases of liver: Secondary | ICD-10-CM | POA: Diagnosis not present

## 2017-09-14 DIAGNOSIS — E279 Disorder of adrenal gland, unspecified: Secondary | ICD-10-CM | POA: Insufficient documentation

## 2017-09-14 DIAGNOSIS — I700 Atherosclerosis of aorta: Secondary | ICD-10-CM | POA: Insufficient documentation

## 2017-09-14 DIAGNOSIS — D649 Anemia, unspecified: Secondary | ICD-10-CM | POA: Diagnosis present

## 2017-09-14 LAB — POCT I-STAT CREATININE: Creatinine, Ser: 0.9 mg/dL (ref 0.61–1.24)

## 2017-09-14 MED ORDER — IOPAMIDOL (ISOVUE-300) INJECTION 61%
100.0000 mL | Freq: Once | INTRAVENOUS | Status: AC | PRN
  Administered 2017-09-14: 100 mL via INTRAVENOUS

## 2017-09-27 ENCOUNTER — Other Ambulatory Visit: Payer: Self-pay | Admitting: Gastroenterology

## 2017-09-27 DIAGNOSIS — R935 Abnormal findings on diagnostic imaging of other abdominal regions, including retroperitoneum: Secondary | ICD-10-CM

## 2017-09-28 ENCOUNTER — Other Ambulatory Visit: Payer: Self-pay | Admitting: Gastroenterology

## 2017-10-03 ENCOUNTER — Ambulatory Visit
Admission: RE | Admit: 2017-10-03 | Discharge: 2017-10-03 | Disposition: A | Discharge status: Home / Self Care | Payer: Medicare Other | Source: Ambulatory Visit | Attending: Gastroenterology | Admitting: Gastroenterology

## 2017-10-03 DIAGNOSIS — I700 Atherosclerosis of aorta: Secondary | ICD-10-CM | POA: Insufficient documentation

## 2017-10-03 DIAGNOSIS — J439 Emphysema, unspecified: Secondary | ICD-10-CM | POA: Diagnosis not present

## 2017-10-03 DIAGNOSIS — R935 Abnormal findings on diagnostic imaging of other abdominal regions, including retroperitoneum: Secondary | ICD-10-CM | POA: Diagnosis present

## 2017-10-03 DIAGNOSIS — N200 Calculus of kidney: Secondary | ICD-10-CM | POA: Insufficient documentation

## 2019-08-16 ENCOUNTER — Emergency Department: Payer: Medicare Other

## 2019-08-16 ENCOUNTER — Inpatient Hospital Stay
Admission: EM | Admit: 2019-08-16 | Discharge: 2019-08-24 | DRG: 480 | Disposition: A | Discharge status: Skilled Nursing Facility | Payer: Medicare Other | Attending: Internal Medicine | Admitting: Internal Medicine

## 2019-08-16 ENCOUNTER — Encounter: Payer: Self-pay | Admitting: Emergency Medicine

## 2019-08-16 DIAGNOSIS — N401 Enlarged prostate with lower urinary tract symptoms: Secondary | ICD-10-CM | POA: Diagnosis present

## 2019-08-16 DIAGNOSIS — R911 Solitary pulmonary nodule: Secondary | ICD-10-CM | POA: Diagnosis present

## 2019-08-16 DIAGNOSIS — F419 Anxiety disorder, unspecified: Secondary | ICD-10-CM | POA: Diagnosis present

## 2019-08-16 DIAGNOSIS — E43 Unspecified severe protein-calorie malnutrition: Secondary | ICD-10-CM | POA: Insufficient documentation

## 2019-08-16 DIAGNOSIS — S72141A Displaced intertrochanteric fracture of right femur, initial encounter for closed fracture: Secondary | ICD-10-CM | POA: Diagnosis present

## 2019-08-16 DIAGNOSIS — J9622 Acute and chronic respiratory failure with hypercapnia: Secondary | ICD-10-CM | POA: Diagnosis present

## 2019-08-16 DIAGNOSIS — Z7952 Long term (current) use of systemic steroids: Secondary | ICD-10-CM

## 2019-08-16 DIAGNOSIS — J189 Pneumonia, unspecified organism: Secondary | ICD-10-CM | POA: Diagnosis present

## 2019-08-16 DIAGNOSIS — W010XXA Fall on same level from slipping, tripping and stumbling without subsequent striking against object, initial encounter: Secondary | ICD-10-CM | POA: Diagnosis present

## 2019-08-16 DIAGNOSIS — I248 Other forms of acute ischemic heart disease: Secondary | ICD-10-CM | POA: Diagnosis present

## 2019-08-16 DIAGNOSIS — Y92009 Unspecified place in unspecified non-institutional (private) residence as the place of occurrence of the external cause: Secondary | ICD-10-CM

## 2019-08-16 DIAGNOSIS — I471 Supraventricular tachycardia: Secondary | ICD-10-CM | POA: Diagnosis not present

## 2019-08-16 DIAGNOSIS — I5031 Acute diastolic (congestive) heart failure: Secondary | ICD-10-CM | POA: Diagnosis not present

## 2019-08-16 DIAGNOSIS — Z7189 Other specified counseling: Secondary | ICD-10-CM | POA: Diagnosis not present

## 2019-08-16 DIAGNOSIS — R000 Tachycardia, unspecified: Secondary | ICD-10-CM | POA: Diagnosis not present

## 2019-08-16 DIAGNOSIS — S72001A Fracture of unspecified part of neck of right femur, initial encounter for closed fracture: Secondary | ICD-10-CM | POA: Diagnosis present

## 2019-08-16 DIAGNOSIS — Y93K1 Activity, walking an animal: Secondary | ICD-10-CM | POA: Diagnosis not present

## 2019-08-16 DIAGNOSIS — I110 Hypertensive heart disease with heart failure: Secondary | ICD-10-CM | POA: Diagnosis present

## 2019-08-16 DIAGNOSIS — Z01810 Encounter for preprocedural cardiovascular examination: Secondary | ICD-10-CM | POA: Diagnosis not present

## 2019-08-16 DIAGNOSIS — Z681 Body mass index (BMI) 19 or less, adult: Secondary | ICD-10-CM | POA: Diagnosis not present

## 2019-08-16 DIAGNOSIS — R64 Cachexia: Secondary | ICD-10-CM | POA: Diagnosis present

## 2019-08-16 DIAGNOSIS — I4510 Unspecified right bundle-branch block: Secondary | ICD-10-CM | POA: Diagnosis present

## 2019-08-16 DIAGNOSIS — R627 Adult failure to thrive: Secondary | ICD-10-CM | POA: Diagnosis present

## 2019-08-16 DIAGNOSIS — I272 Pulmonary hypertension, unspecified: Secondary | ICD-10-CM | POA: Diagnosis present

## 2019-08-16 DIAGNOSIS — I10 Essential (primary) hypertension: Secondary | ICD-10-CM

## 2019-08-16 DIAGNOSIS — Z79899 Other long term (current) drug therapy: Secondary | ICD-10-CM

## 2019-08-16 DIAGNOSIS — Z9981 Dependence on supplemental oxygen: Secondary | ICD-10-CM

## 2019-08-16 DIAGNOSIS — D62 Acute posthemorrhagic anemia: Secondary | ICD-10-CM | POA: Diagnosis not present

## 2019-08-16 DIAGNOSIS — J441 Chronic obstructive pulmonary disease with (acute) exacerbation: Secondary | ICD-10-CM | POA: Diagnosis present

## 2019-08-16 DIAGNOSIS — J440 Chronic obstructive pulmonary disease with acute lower respiratory infection: Secondary | ICD-10-CM | POA: Diagnosis present

## 2019-08-16 DIAGNOSIS — T148XXA Other injury of unspecified body region, initial encounter: Secondary | ICD-10-CM

## 2019-08-16 DIAGNOSIS — R54 Age-related physical debility: Secondary | ICD-10-CM | POA: Diagnosis present

## 2019-08-16 DIAGNOSIS — Z20822 Contact with and (suspected) exposure to covid-19: Secondary | ICD-10-CM | POA: Diagnosis present

## 2019-08-16 DIAGNOSIS — W19XXXA Unspecified fall, initial encounter: Secondary | ICD-10-CM

## 2019-08-16 DIAGNOSIS — R339 Retention of urine, unspecified: Secondary | ICD-10-CM | POA: Diagnosis present

## 2019-08-16 DIAGNOSIS — Z66 Do not resuscitate: Secondary | ICD-10-CM | POA: Diagnosis not present

## 2019-08-16 DIAGNOSIS — M069 Rheumatoid arthritis, unspecified: Secondary | ICD-10-CM | POA: Diagnosis present

## 2019-08-16 DIAGNOSIS — K5900 Constipation, unspecified: Secondary | ICD-10-CM | POA: Diagnosis not present

## 2019-08-16 DIAGNOSIS — Z515 Encounter for palliative care: Secondary | ICD-10-CM | POA: Diagnosis not present

## 2019-08-16 DIAGNOSIS — J9621 Acute and chronic respiratory failure with hypoxia: Secondary | ICD-10-CM | POA: Diagnosis present

## 2019-08-16 DIAGNOSIS — I479 Paroxysmal tachycardia, unspecified: Secondary | ICD-10-CM

## 2019-08-16 DIAGNOSIS — R9431 Abnormal electrocardiogram [ECG] [EKG]: Secondary | ICD-10-CM | POA: Diagnosis not present

## 2019-08-16 DIAGNOSIS — S72001S Fracture of unspecified part of neck of right femur, sequela: Secondary | ICD-10-CM | POA: Diagnosis not present

## 2019-08-16 HISTORY — DX: Chronic obstructive pulmonary disease, unspecified: J44.9

## 2019-08-16 HISTORY — DX: Rheumatoid arthritis, unspecified: M06.9

## 2019-08-16 LAB — COMPREHENSIVE METABOLIC PANEL
ALT: 14 U/L (ref 0–44)
AST: 19 U/L (ref 15–41)
Albumin: 3.1 g/dL — ABNORMAL LOW (ref 3.5–5.0)
Alkaline Phosphatase: 83 U/L (ref 38–126)
Anion gap: 9 (ref 5–15)
BUN: 22 mg/dL (ref 8–23)
CO2: 33 mmol/L — ABNORMAL HIGH (ref 22–32)
Calcium: 9.4 mg/dL (ref 8.9–10.3)
Chloride: 98 mmol/L (ref 98–111)
Creatinine, Ser: 1.25 mg/dL — ABNORMAL HIGH (ref 0.61–1.24)
GFR calc Af Amer: >60 mL/min (ref >60)
GFR calc non Af Amer: 53 mL/min — ABNORMAL LOW (ref >60)
Glucose, Bld: 176 mg/dL — ABNORMAL HIGH (ref 70–99)
Potassium: 4.4 mmol/L (ref 3.5–5.1)
Sodium: 140 mmol/L (ref 135–145)
Total Bilirubin: 0.6 mg/dL (ref 0.3–1.2)
Total Protein: 6.4 g/dL — ABNORMAL LOW (ref 6.5–8.1)

## 2019-08-16 LAB — RESPIRATORY PANEL BY RT PCR (FLU A&B, COVID)
Influenza A by PCR: NEGATIVE
Influenza B by PCR: NEGATIVE
SARS Coronavirus 2 by RT PCR: NEGATIVE

## 2019-08-16 LAB — BLOOD GAS, VENOUS
Acid-Base Excess: 8.3 mmol/L — ABNORMAL HIGH (ref 0.0–2.0)
Bicarbonate: 38.6 mmol/L — ABNORMAL HIGH (ref 20.0–28.0)
O2 Saturation: 51.8 %
Patient temperature: 37
pCO2, Ven: 84 mmHg (ref 44.0–60.0)
pH, Ven: 7.27 (ref 7.250–7.430)
pO2, Ven: 32 mmHg (ref 32.0–45.0)

## 2019-08-16 LAB — PROTIME-INR
INR: 1 (ref 0.8–1.2)
Prothrombin Time: 13.4 seconds (ref 11.4–15.2)

## 2019-08-16 LAB — TROPONIN I (HIGH SENSITIVITY)
Troponin I (High Sensitivity): 36 ng/L — ABNORMAL HIGH (ref <18)
Troponin I (High Sensitivity): 69 ng/L — ABNORMAL HIGH (ref <18)

## 2019-08-16 LAB — CBC WITH DIFFERENTIAL/PLATELET
Abs Immature Granulocytes: 0.11 10*3/uL — ABNORMAL HIGH (ref 0.00–0.07)
Basophils Absolute: 0.1 10*3/uL (ref 0.0–0.1)
Basophils Relative: 1 %
Eosinophils Absolute: 0.4 10*3/uL (ref 0.0–0.5)
Eosinophils Relative: 3 %
HCT: 34.2 % — ABNORMAL LOW (ref 39.0–52.0)
Hemoglobin: 10.2 g/dL — ABNORMAL LOW (ref 13.0–17.0)
Immature Granulocytes: 1 %
Lymphocytes Relative: 8 %
Lymphs Abs: 1.1 10*3/uL (ref 0.7–4.0)
MCH: 32.6 pg (ref 26.0–34.0)
MCHC: 29.8 g/dL — ABNORMAL LOW (ref 30.0–36.0)
MCV: 109.3 fL — ABNORMAL HIGH (ref 80.0–100.0)
Monocytes Absolute: 0.7 10*3/uL (ref 0.1–1.0)
Monocytes Relative: 5 %
Neutro Abs: 11.9 10*3/uL — ABNORMAL HIGH (ref 1.7–7.7)
Neutrophils Relative %: 82 %
Platelets: 219 10*3/uL (ref 150–400)
RBC: 3.13 MIL/uL — ABNORMAL LOW (ref 4.22–5.81)
RDW: 12 % (ref 11.5–15.5)
WBC: 14.2 10*3/uL — ABNORMAL HIGH (ref 4.0–10.5)
nRBC: 0 % (ref 0.0–0.2)

## 2019-08-16 MED ORDER — SODIUM CHLORIDE 0.9 % IV SOLN
500.0000 mg | Freq: Once | INTRAVENOUS | Status: AC
  Administered 2019-08-17: 500 mg via INTRAVENOUS
  Filled 2019-08-16: qty 500

## 2019-08-16 MED ORDER — HYDROCODONE-ACETAMINOPHEN 5-325 MG PO TABS
1.0000 | ORAL_TABLET | Freq: Four times a day (QID) | ORAL | Status: DC | PRN
  Administered 2019-08-18 (×2): 1 via ORAL
  Administered 2019-08-18: 2 via ORAL
  Administered 2019-08-19 – 2019-08-24 (×5): 1 via ORAL
  Filled 2019-08-16 (×5): qty 1
  Filled 2019-08-16: qty 2
  Filled 2019-08-16 (×2): qty 1

## 2019-08-16 MED ORDER — ALBUTEROL SULFATE (2.5 MG/3ML) 0.083% IN NEBU
2.5000 mg | INHALATION_SOLUTION | RESPIRATORY_TRACT | Status: DC | PRN
  Administered 2019-08-17: 2.5 mg via RESPIRATORY_TRACT
  Filled 2019-08-16: qty 3

## 2019-08-16 MED ORDER — ALBUTEROL SULFATE (2.5 MG/3ML) 0.083% IN NEBU
2.5000 mg | INHALATION_SOLUTION | RESPIRATORY_TRACT | Status: AC
  Administered 2019-08-17: 2.5 mg via RESPIRATORY_TRACT
  Filled 2019-08-16: qty 3

## 2019-08-16 MED ORDER — HYDROMORPHONE HCL 1 MG/ML IJ SOLN
1.0000 mg | Freq: Once | INTRAMUSCULAR | Status: AC
  Administered 2019-08-16: 1 mg via INTRAVENOUS
  Filled 2019-08-16: qty 1

## 2019-08-16 MED ORDER — SODIUM CHLORIDE 0.9 % IV SOLN
1.0000 g | Freq: Once | INTRAVENOUS | Status: AC
  Administered 2019-08-17: 1 g via INTRAVENOUS
  Filled 2019-08-16: qty 10

## 2019-08-16 MED ORDER — IOHEXOL 350 MG/ML SOLN
75.0000 mL | Freq: Once | INTRAVENOUS | Status: AC | PRN
  Administered 2019-08-16: 75 mL via INTRAVENOUS

## 2019-08-16 MED ORDER — SODIUM CHLORIDE 0.9 % IV SOLN
INTRAVENOUS | Status: DC
  Administered 2019-08-19: 75 mL/h via INTRAVENOUS

## 2019-08-16 MED ORDER — BISACODYL 5 MG PO TBEC
5.0000 mg | DELAYED_RELEASE_TABLET | Freq: Every day | ORAL | Status: DC | PRN
  Administered 2019-08-21: 5 mg via ORAL
  Filled 2019-08-16: qty 1

## 2019-08-16 MED ORDER — SODIUM CHLORIDE 0.9 % IV BOLUS
1000.0000 mL | Freq: Once | INTRAVENOUS | Status: AC
  Administered 2019-08-16: 1000 mL via INTRAVENOUS

## 2019-08-16 MED ORDER — FENTANYL CITRATE (PF) 100 MCG/2ML IJ SOLN
25.0000 ug | Freq: Once | INTRAMUSCULAR | Status: AC
  Administered 2019-08-16: 25 ug via INTRAVENOUS
  Filled 2019-08-16: qty 2

## 2019-08-16 MED ORDER — IPRATROPIUM-ALBUTEROL 0.5-2.5 (3) MG/3ML IN SOLN
3.0000 mL | Freq: Four times a day (QID) | RESPIRATORY_TRACT | Status: DC
  Administered 2019-08-17 – 2019-08-19 (×9): 3 mL via RESPIRATORY_TRACT
  Filled 2019-08-16 (×9): qty 3

## 2019-08-16 MED ORDER — SODIUM CHLORIDE 0.9 % IV SOLN
2.0000 g | INTRAVENOUS | Status: DC
  Administered 2019-08-17 – 2019-08-18 (×2): 2 g via INTRAVENOUS
  Filled 2019-08-16: qty 20
  Filled 2019-08-16 (×2): qty 2
  Filled 2019-08-16 (×2): qty 20

## 2019-08-16 MED ORDER — MORPHINE SULFATE (PF) 2 MG/ML IV SOLN
0.5000 mg | INTRAVENOUS | Status: DC | PRN
  Administered 2019-08-17 – 2019-08-18 (×4): 0.5 mg via INTRAVENOUS
  Filled 2019-08-16 (×4): qty 1

## 2019-08-16 MED ORDER — METHYLPREDNISOLONE SODIUM SUCC 125 MG IJ SOLR
125.0000 mg | Freq: Once | INTRAMUSCULAR | Status: AC
  Administered 2019-08-17: 125 mg via INTRAVENOUS
  Filled 2019-08-16: qty 2

## 2019-08-16 MED ORDER — ALBUTEROL SULFATE HFA 108 (90 BASE) MCG/ACT IN AERS: 2.0000 | INHALATION_SPRAY | Freq: Once | RESPIRATORY_TRACT | Status: DC

## 2019-08-16 MED ORDER — SODIUM CHLORIDE 0.9 % IV SOLN
500.0000 mg | INTRAVENOUS | Status: DC
  Administered 2019-08-17: 500 mg via INTRAVENOUS
  Filled 2019-08-16: qty 500

## 2019-08-16 NOTE — Consult Note (Signed)
Full consult note to follow. Plan a right hip trochanteric femoral nail tomorrow if cleared by primary team. NPO after midnight. Rapid COVID test.

## 2019-08-16 NOTE — H&P (Addendum)
History and Physical    Thomas Nicholson TKP:[id number] DOB: 11-06-1932 DOA: 08/16/2019  PCP: Patient, No Pcp Per   Patient coming from: home I have personally briefly reviewed patient's old medical records in [place]  Chief Complaint: Fall onto her right side, right hip pain  HPI: Thomas Nicholson is a 86 y.o. male with medical history significant for COPD on home O2 at 2 L and rheumatoid arthritis brought in following fall onto his right side while walking his dog.  He hit his head on the door but did not lose consciousness.  States he was trying to open the door but this spring to the door was broken and he ended up falling.  He denies preceding lightheadedness, weakness, chest pain, palpitations or shortness of breath beyond his baseline.  Denies cough and his baseline for his COPD.Marland Kitchen Also states he was in his usual state of health and had no recent illness prior to the fall.  ED Course: On arrival in the emergency room he was noted to be tachycardic at 110 as well as tachypneic.  He was also hypoxic, requiring up to 5 L to keep sats in the low 90s.  His blood work was for the most part unremarkable.  Hip x-ray showed right intertrochanteric fracture.  Because of tachycardia and tachypnea he had a CTA chest that was negative for PE but showed right middle lobe pneumonia as well as a right upper lobe pulmonary nodule and several other nonacute findings.  The emergency room provider spoke with orthopedist Dr. Harlow Mares.  He was started on IV antibiotics for pneumonia and duo nebs pending Covid testing.  Hospitalist consulted for admission. Review of Systems: As per HPI otherwise 10 point review of systems negative.    Past Medical History:  Diagnosis Date   COPD (chronic obstructive pulmonary disease) ([place])     History reviewed. No pertinent surgical history.   reports that he has never smoked. He has never used smokeless tobacco. He reports previous alcohol use. He reports previous  drug use.  No Known Allergies  No family history on file.   Prior to Admission medications   Medication Sig Start Date End Date Taking? Authorizing Provider  cyanocobalamin 1000 MCG tablet Take 1,000 mcg by mouth daily. 03/21/19  Yes [provider]  sulfaSALAzine (AZULFIDINE) 500 MG tablet Take 1,000 mg by mouth 2 (two) times daily. 04/14/19  Yes [provider]  albuterol (VENTOLIN HFA) 108 (90 Base) MCG/ACT inhaler Inhale 2 puffs into the lungs every 6 (six) hours as needed for wheezing or shortness of breath. 07/07/19   [provider]  alendronate (FOSAMAX) 70 MG tablet Take 70 mg by mouth every Friday. 08/11/19   [provider]  folic acid (FOLVITE) 1 MG tablet Take 1 mg by mouth daily. 07/28/19   [provider]  HYDROcodone-acetaminophen (NORCO/VICODIN) 5-325 MG tablet Take 1 tablet by mouth 2 (two) times daily as needed for pain. 07/08/19   [provider]  omeprazole (PRILOSEC) 20 MG capsule Take 20 mg by mouth daily. 04/04/19   [provider]  predniSONE (DELTASONE) 5 MG tablet Take 5 mg by mouth daily. 05/28/19   [provider]  tamsulosin (FLOMAX) 0.4 MG CAPS capsule Take 0.4 mg by mouth daily. 07/28/19   [provider]  Grant Ruts INHUB 250-50 MCG/DOSE AEPB Inhale 1 puff into the lungs 2 (two) times daily. 07/04/19   [provider]    Physical Exam: Vitals:   08/16/19 1910  08/16/19 1911 08/16/19 1912 08/16/19 2100  BP:  (!) 191/77  (!) 160/93  Pulse:  (!) 110  89  Resp:  20  (!) 23  Temp: 97.6 F (36.4 C) 97.6 F (36.4 C)    TempSrc: Oral     SpO2:  94%  95%  Weight:   46.3 kg   Height:    (1.702 m)      Vitals:   08/16/19 1910 08/16/19 1911 08/16/19 1912 08/16/19 2100  BP:  (!) 191/77  (!) 160/93  Pulse:  (!) 110  89  Resp:  20  (!) 23  Temp: 97.6 F (36.4 C) 97.6 F (36.4 C)    TempSrc: Oral     SpO2:  94%  95%  Weight:   46.3 kg   Height:    (1.702 m)      Constitutional: Alert and awake, oriented x3, not in any acute distress.  Has a cachectic appearance Eyes: PERLA, EOMI, irises appear normal, anicteric sclera,  ENMT: external ears and nose appear normal, normal hearing             Lips appears normal, oropharynx mucosa, tongue, posterior pharynx appear normal  Neck: neck appears normal, no masses, normal ROM, no thyromegaly, no JVD  CVS: S1-S2 clear, no murmur rubs or gallops,  , no carotid bruits, pedal pulses palpable, No LE edema Respiratory: bilateral  wheezing,and rhonchi. Respiratory effort normal slightly increased. No accessory muscle use.  Abdomen: soft nontender, nondistended, normal bowel sounds, no hepatosplenomegaly, no hernias Musculoskeletal: : no cyanosis, clubbing , deformity of right hip with leg appearing shortened and in flexion abduction and external rotation Neuro: Cranial nerves II-XII intact, sensation, reflexes normal, strength Psych: judgement and insight appear normal, stable mood and affect,  Skin: multiple bruises on extremities   Labs on Admission: I have personally reviewed following labs and imaging studies  CBC: Recent Labs  Lab 08/16/19 1931  WBC 14.2*  NEUTROABS 11.9*  HGB 10.2*  HCT 34.2*  MCV 109.3*  PLT 219   Basic Metabolic Panel: Recent Labs  Lab 08/16/19 1931  NA 140  K 4.4  CL 98  CO2 33*  GLUCOSE 176*  BUN 22  CREATININE 1.25*  CALCIUM 9.4   GFR: Estimated Creatinine Clearance: 28.8 mL/min (A) (by C-G formula based on SCr of 1.25 mg/dL (H)). Liver Function Tests: Recent Labs  Lab 08/16/19 1931  AST 19  ALT 14  ALKPHOS 83  BILITOT 0.6  PROT 6.4*  ALBUMIN 3.1*   No results for input(s): LIPASE, AMYLASE in the last 168 hours. No results for input(s): AMMONIA in the last 168 hours. Coagulation Profile: Recent Labs  Lab 08/16/19 1931  INR 1.0   Cardiac Enzymes: No results for input(s): CKTOTAL, CKMB, CKMBINDEX, TROPONINI in the last 168 hours. BNP (last 3  results) No results for input(s): PROBNP in the last 8760 hours. HbA1C: No results for input(s): HGBA1C in the last 72 hours. CBG: No results for input(s): GLUCAP in the last 168 hours. Lipid Profile: No results for input(s): CHOL, HDL, LDLCALC, TRIG, CHOLHDL, LDLDIRECT in the last 72 hours. Thyroid Function Tests: No results for input(s): TSH, T4TOTAL, FREET4, T3FREE, THYROIDAB in the last 72 hours. Anemia Panel: No results for input(s): VITAMINB12, FOLATE, FERRITIN, TIBC, IRON, RETICCTPCT in the last 72 hours. Urine analysis: No results found for: COLORURINE, APPEARANCEUR, LABSPEC, PHURINE, GLUCOSEU, HGBUR, BILIRUBINUR, KETONESUR, PROTEINUR, UROBILINOGEN, NITRITE, LEUKOCYTESUR  Radiological Exams on Admission: DG Chest 1 View  Result Date:  08/16/2019 CLINICAL DATA:  Pt to ED by EMS after unwitnessed fall while walking his dog. Pt was found outside by EMS and states he was outside for approx 30 mins. EXAM: CHEST  1 VIEW COMPARISON:  Chest radiograph 11/16/2005 FINDINGS: Tortuous thoracic aorta. Normal heart size. Lungs are hyperinflated. Linear opacities at the right lung base likely reflect scarring or atelectasis. Small right pleural effusion or pleural thickening. There is an area of lucency under the central hemidiaphragm of uncertain etiology. IMPRESSION: 1. Lucency under the central hemidiaphragm of uncertain etiology. Recommend abdominal radiographs to exclude free air. 2. Small right pleural effusion or pleural thickening. Minimal opacities at the right lung base likely reflect atelectasis or scarring. 3. COPD. Electronically Signed   By: Emmaline Kluver M.D.   On: 08/16/2019 20:30   CT Head Wo Contrast  Result Date: 08/16/2019 CLINICAL DATA:  Fall while walking dog, found down after 30 minutes EXAM: CT HEAD WITHOUT CONTRAST CT CERVICAL SPINE WITHOUT CONTRAST TECHNIQUE: Multidetector CT imaging of the head and cervical spine was performed following the standard protocol without  intravenous contrast. Multiplanar CT image reconstructions of the cervical spine were also generated. COMPARISON:  Cervical spine MRI January 04, 2006 FINDINGS: CT HEAD FINDINGS Brain: No evidence of acute infarction, hemorrhage, hydrocephalus, extra-axial collection or mass lesion/mass effect. Symmetric prominence of the ventricles, cisterns and sulci compatible with parenchymal volume loss. Patchy areas of white matter hypoattenuation are most compatible with chronic microvascular angiopathy. Vascular: Atherosclerotic calcification of the carotid siphons and intradural vertebral arteries. No hyperdense vessel. Skull: No calvarial fracture or suspicious osseous lesion. No scalp swelling or hematoma. Sinuses/Orbits: Paranasal sinuses and mastoid air cells are predominantly clear. Included orbital structures are unremarkable. Benign senescent scleral plaque. Other: Age-indeterminate deformities of the bilateral nasal bones, favor chronic in the absence of swelling. CT CERVICAL SPINE FINDINGS Alignment: Straightening of normal cervical lordosis. Bony fusion across the C5-6 vertebral bodies. Partial fusion across the C5-6 facets on the left as well. Mild anterolisthesis of C4 on 5 is likely on a degenerative basis reflecting some adjacent segment disease. Appearance is similar to comparison MRI from January 04, 2006. No abnormally widened, jumped or perched facets. Skull base and vertebrae: Fusion of C5-6, as above. The osseous structures appear diffusely demineralized which may limit detection of small or nondisplaced fractures. No definite acute fracture. No primary bone lesion or focal pathologic process. Multilevel Schmorl's node formations are noted. Soft tissues and spinal canal: No pre or paravertebral fluid or swelling. No visible canal hematoma. Disc levels: Advanced multilevel cervical spondylitic and facet degenerative changes throughout the cervical spine. Subchondral sclerosis and cystic change of the dens at  the atlantodental articulation. Mild-to-moderate canal stenoses are noted at C4-5 and C6-7. Additional uncinate spurring and advanced hypertrophic facet degenerative change results in mild-to-moderate multilevel neural foraminal narrowing as well. Upper chest: Extensive severe centrilobular and paraseptal emphysematous changes with some larger apical bulla. Coronal narrowing of the trachea, likely reflective of COPD, with scattered secretions. Other: Normal thyroid. Cervical carotid atherosclerosis. IMPRESSION: 1. No acute intracranial abnormality. 2. Small vessel ischemic disease and parenchymal volume loss. 3. Age-indeterminate deformities of the bilateral nasal bones, favor chronic in the absence of swelling. Correlate for point tenderness. 4. No acute fracture or traumatic listhesis of the cervical spine. 5. Advanced multilevel cervical spondylitic and facet degenerative changes. 6.  Emphysema (ICD10-J43.9) and features of COPD. 7. Cervical and intracranial atherosclerosis. Electronically Signed   By: Kreg Shropshire M.D.   On: 08/16/2019 20:07  CT Angio Chest PE W and/or Wo Contrast  Result Date: 08/16/2019 CLINICAL DATA:  Acute pain due to trauma EXAM: CT CHEST, ABDOMEN, AND PELVIS WITH CONTRAST TECHNIQUE: Multidetector CT imaging of the chest, abdomen and pelvis was performed following the standard protocol during bolus administration of intravenous contrast. CONTRAST:  18mL OMNIPAQUE IOHEXOL 350 MG/ML SOLN COMPARISON:  Oct 03, 2017. FINDINGS: CT CHEST FINDINGS Cardiovascular: Contrast injection is sufficient to demonstrate satisfactory opacification of the pulmonary arteries to the segmental level. There is no pulmonary embolus. The main pulmonary artery is dilated measuring up to approximately 3.2 cm. There is no CT evidence of acute right heart strain. There is extensive irregular calcified and noncalcified plaque at the aortic arch. The heart size is normal. Coronary artery calcifications are noted.  Mediastinum/Nodes: --No mediastinal or hilar lymphadenopathy. --No axillary lymphadenopathy. --No supraclavicular lymphadenopathy. --Normal thyroid gland. --The esophagus is unremarkable Lungs/Pleura: There is bronchial wall thickening and mucus plugging at the lung bases bilaterally, right worse than left. There is some consolidation in the medial right lower lobe. There is an airspace opacity in the medial right upper lobe measuring approximately 1.5 cm (axial series 6, image 30). This is new since 2008. There are severe emphysematous changes bilaterally. There is a 5 mm pulmonary nodule in the left lower lobe (axial series 4, image 83). This is stable since 2019 and is consistent with a benign process. There is a large calcified granuloma involving the posterior right lower lobe. The trachea is unremarkable. There is no pneumothorax. No significant pleural effusion. Musculoskeletal: There is mild height loss of the T3 vertebral body, likely chronic. There is mild height loss of the T9 vertebral body, likely chronic. Review of the MIP images confirms the above findings. CT ABDOMEN PELVIS FINDINGS Hepatobiliary: Multiple hepatic cysts are noted. These are relatively stable from prior study. Normal gallbladder.There is no biliary ductal dilation. Pancreas: Normal contours without ductal dilatation. No peripancreatic fluid collection. Spleen: No splenic laceration or hematoma. Adrenals/Urinary Tract: --Adrenal glands: No adrenal hemorrhage. --Right kidney/ureter: No hydronephrosis or perinephric hematoma. --Left kidney/ureter: There are nonobstructing stones in the lower pole the left kidney. --Urinary bladder: Unremarkable. Stomach/Bowel: --Stomach/Duodenum: Stomach is distended with an air-fluid level. --Small bowel: No dilatation or inflammation. --Colon: There is a large amount of stool in the colon. --Appendix: Normal. Vascular/Lymphatic: Atherosclerotic calcification is present within the non-aneurysmal  abdominal aorta, without hemodynamically significant stenosis. Is a probable high-grade stenosis involving the external iliac artery on the left (axial series 2, image 54). --No retroperitoneal lymphadenopathy. --No mesenteric lymphadenopathy. --No pelvic or inguinal lymphadenopathy. Reproductive: Unremarkable Other: No ascites or free air. The abdominal wall is normal. Musculoskeletal. There is an acute displaced fracture of the proximal right femur. There are end-stage degenerative changes of both hips. IMPRESSION: 1. Acute displaced fracture of the proximal right femur. There are end-stage degenerative changes of both hips. 2. Small amount of consolidation in the medial right lower lobe with associated bronchial wall thickening and mucus plugging is suggestive of infectious or reactive bronchiolitis. 3. There is a 1.5 cm airspace opacity in the medial right upper lobe. This may represent an infiltrate, atelectasis, or pulmonary nodule. A three-month follow-up CT of the chest is recommended for further evaluation. 4. Severe emphysematous changes. 5. Large amount of stool in the colon may indicate constipation. 6. Suspected high-grade stenosis of the left external iliac artery. 7. Nonobstructive left lower pole stones. 8. Dilated main pulmonary artery which can be seen in patients with elevated pulmonary artery pressures.  9. Aortic Atherosclerosis (ICD10-I70.0) and Emphysema (ICD10-J43.9). Electronically Signed   By: Christopher  Green M.D.   On: 08/16/2019 22:32   CT Cervical Spine Wo Contrast  Result Date: 08/16/2019 CLINICAL DATA:  Fall while walking dog, found down after 30 minutes EXAM: CT HEAD WITHOUT CONTRAST CT CERVICAL SPINE WITHOUT CONTRAST TECHNIQUE: Multidetector CT imaging of the head and cervical spine was performed following the standard protocol without intravenous contrast. Multiplanar CT image reconstructions of the cervical spine were also generated. COMPARISON:  Cervical spine MRI January 04, 2006 FINDINGS: CT HEAD FINDINGS Brain: No evidence of acute infarction, hemorrhage, hydrocephalus, extra-axial collection or mass lesion/mass effect. Symmetric prominence of the ventricles, cisterns and sulci compatible with parenchymal volume loss. Patchy areas of white matter hypoattenuation are most compatible with chronic microvascular angiopathy. Vascular: Atherosclerotic calcification of the carotid siphons and intradural vertebral arteries. No hyperdense vessel. Skull: No calvarial fracture or suspicious osseous lesion. No scalp swelling or hematoma. Sinuses/Orbits: Paranasal sinuses and mastoid air cells are predominantly clear. Included orbital structures are unremarkable. Benign senescent scleral plaque. Other: Age-indeterminate deformities of the bilateral nasal bones, favor chronic in the absence of swelling. CT CERVICAL SPINE FINDINGS Alignment: Straightening of normal cervical lordosis. Bony fusion across the C5-6 vertebral bodies. Partial fusion across the C5-6 facets on the left as well. Mild anterolisthesis of C4 on 5 is likely on a degenerative basis reflecting some adjacent segment disease. Appearance is similar to comparison MRI from January 04, 2006. No abnormally widened, jumped or perched facets. Skull base and vertebrae: Fusion of C5-6, as above. The osseous structures appear diffusely demineralized which may limit detection of small or nondisplaced fractures. No definite acute fracture. No primary bone lesion or focal pathologic process. Multilevel Schmorl's node formations are noted. Soft tissues and spinal canal: No pre or paravertebral fluid or swelling. No visible canal hematoma. Disc levels: Advanced multilevel cervical spondylitic and facet degenerative changes throughout the cervical spine. Subchondral sclerosis and cystic change of the dens at the atlantodental articulation. Mild-to-moderate canal stenoses are noted at C4-5 and C6-7. Additional uncinate spurring and advanced  hypertrophic facet degenerative change results in mild-to-moderate multilevel neural foraminal narrowing as well. Upper chest: Extensive severe centrilobular and paraseptal emphysematous changes with some larger apical bulla. Coronal narrowing of the trachea, likely reflective of COPD, with scattered secretions. Other: Normal thyroid. Cervical carotid atherosclerosis. IMPRESSION: 1. No acute intracranial abnormality. 2. Small vessel ischemic disease and parenchymal volume loss. 3. Age-indeterminate deformities of the bilateral nasal bones, favor chronic in the absence of swelling. Correlate for point tenderness. 4. No acute fracture or traumatic listhesis of the cervical spine. 5. Advanced multilevel cervical spondylitic and facet degenerative changes. 6.  Emphysema (ICD10-J43.9) and features of COPD. 7. Cervical and intracranial atherosclerosis. Electronically Signed   By: Price  DeHay M.D.   On: 08/16/2019 20:07   CT ABDOMEN PELVIS W CONTRAST  Result Date: 08/16/2019 CLINICAL DATA:  Acute pain due to trauma EXAM: CT CHEST, ABDOMEN, AND PELVIS WITH CONTRAST TECHNIQUE: Multidetector CT imaging of the chest, abdomen and pelvis was performed following the standard protocol during bolus administration of intravenous contrast. CONTRAST:  [place] OMNIPAQUE IOHEXOL 350 MG/ML SOLN COMPARISON:  Oct 03, 2017. FINDINGS: CT CHEST FINDINGS Cardiovascular: Contrast injection is sufficient to demonstrate satisfactory opacification of the pulmonary arteries to the segmental level. There is no pulmonary embolus. The main pulmonary artery is dilated measuring up to approximately 3.2 cm. There is no CT evidence of acute right heart strain. There is extensive irregular calcified  and noncalcified plaque at the aortic arch. The heart size is normal. Coronary artery calcifications are noted. Mediastinum/Nodes: --No mediastinal or hilar lymphadenopathy. --No axillary lymphadenopathy. --No supraclavicular lymphadenopathy. --Normal thyroid  gland. --The esophagus is unremarkable Lungs/Pleura: There is bronchial wall thickening and mucus plugging at the lung bases bilaterally, right worse than left. There is some consolidation in the medial right lower lobe. There is an airspace opacity in the medial right upper lobe measuring approximately 1.5 cm (axial series 6, image 30). This is new since 2008. There are severe emphysematous changes bilaterally. There is a 5 mm pulmonary nodule in the left lower lobe (axial series 4, image 83). This is stable since 2019 and is consistent with a benign process. There is a large calcified granuloma involving the posterior right lower lobe. The trachea is unremarkable. There is no pneumothorax. No significant pleural effusion. Musculoskeletal: There is mild height loss of the T3 vertebral body, likely chronic. There is mild height loss of the T9 vertebral body, likely chronic. Review of the MIP images confirms the above findings. CT ABDOMEN PELVIS FINDINGS Hepatobiliary: Multiple hepatic cysts are noted. These are relatively stable from prior study. Normal gallbladder.There is no biliary ductal dilation. Pancreas: Normal contours without ductal dilatation. No peripancreatic fluid collection. Spleen: No splenic laceration or hematoma. Adrenals/Urinary Tract: --Adrenal glands: No adrenal hemorrhage. --Right kidney/ureter: No hydronephrosis or perinephric hematoma. --Left kidney/ureter: There are nonobstructing stones in the lower pole the left kidney. --Urinary bladder: Unremarkable. Stomach/Bowel: --Stomach/Duodenum: Stomach is distended with an air-fluid level. --Small bowel: No dilatation or inflammation. --Colon: There is a large amount of stool in the colon. --Appendix: Normal. Vascular/Lymphatic: Atherosclerotic calcification is present within the non-aneurysmal abdominal aorta, without hemodynamically significant stenosis. Is a probable high-grade stenosis involving the external iliac artery on the left (axial  series 2, image 54). --No retroperitoneal lymphadenopathy. --No mesenteric lymphadenopathy. --No pelvic or inguinal lymphadenopathy. Reproductive: Unremarkable Other: No ascites or free air. The abdominal wall is normal. Musculoskeletal. There is an acute displaced fracture of the proximal right femur. There are end-stage degenerative changes of both hips. IMPRESSION: 1. Acute displaced fracture of the proximal right femur. There are end-stage degenerative changes of both hips. 2. Small amount of consolidation in the medial right lower lobe with associated bronchial wall thickening and mucus plugging is suggestive of infectious or reactive bronchiolitis. 3. There is a 1.5 cm airspace opacity in the medial right upper lobe. This may represent an infiltrate, atelectasis, or pulmonary nodule. A three-month follow-up CT of the chest is recommended for further evaluation. 4. Severe emphysematous changes. 5. Large amount of stool in the colon may indicate constipation. 6. Suspected high-grade stenosis of the left external iliac artery. 7. Nonobstructive left lower pole stones. 8. Dilated main pulmonary artery which can be seen in patients with elevated pulmonary artery pressures. 9. Aortic Atherosclerosis (ICD10-I70.0) and Emphysema (ICD10-J43.9). Electronically Signed   By: Katherine Mantle M.D.   On: 08/16/2019 22:32   DG Hip Unilat W or Wo Pelvis 2-3 Views Right  Result Date: 08/16/2019 CLINICAL DATA:  Unwitnessed fall EXAM: DG HIP (WITH OR WITHOUT PELVIS) 2-3V RIGHT COMPARISON:  09/14/2017 FINDINGS: Acute intertrochanteric fracture of the proximal right femur without significant displacement or angulation. No hip dislocation. Severe degenerative changes of the bilateral hip joints. Bony pelvis appears intact. Sacrum and pelvis are partially obscured by overlying bowel gas and stool. IMPRESSION: Acute intertrochanteric fracture of the proximal right femur without significant displacement or angulation.  Electronically Signed   By: Janyth Pupa  Plundo D.O.   On: 08/16/2019 20:27    EKG: Independently reviewed.   Assessment/Plan Principal Problem:   Closed fracture of femur, intertrochanteric, right, initial encounter (HCC) Accidental fall at home, initial encounter -Orthopedic consult for continued management -Pain control -Given acute hypoxia will defer surgical clearance until medically optimized    Acute on chronic respiratory failure with hypoxia and hypercapnia (HCC)   Right middle lobe pneumonia/community-acquired versus aspiration   COPD with acute exacerbation (HCC) -CTA chest ruled out acute PE -Right middle lobe infiltrate seen -IV Rocephin and azithromycin -Scheduled and as needed duo nebs pending Covid rule out -Supplemental oxygen to keep sats over 90%  Elevated troponin -Troponin was 36>>69 suspect related to demand ischemia -Patient denies chest pain and has no EKG changes -Continue to cycle to peak -Echocardiogram in the a.m.  BPH with LUTS and acute urinary retention -Continue tamsulosin --patient noted to have acute urinary retention while awaiting an inpatient bed with several failed attempts by Er provider to place foley. Bladder scan with at time of attempt --urology consult placed     Pulmonary nodule -CT chest in 3 months recommended by radiologist  Rheumatoid arthritis -Continue sulfasalazine    DVT prophylaxis: SCDs Code Status: full code  Family Communication:  none  Disposition Plan: Back to previous home environment Consults called: Dr. Odis Luster, orthopedics, urology, Dr. Apolinar Junes Status: Inpatient    Andris Baumann MD [facility]     08/16/2019, 11:19 PM

## 2019-08-16 NOTE — ED Notes (Signed)
Condom cath applied by this tech. Tom, RN in with this tech.

## 2019-08-16 NOTE — ED Provider Notes (Signed)
[facility] EMERGENCY DEPARTMENT Provider Note   CSN: [id number] Arrival date & time: 08/16/19  1856     History Chief Complaint  Patient presents with  . Hip Injury    Thomas Nicholson is a 86 y.o. male hx of COPD on 2 L [place] at baseline, hypertension, rheumatoid arthritis here presenting with fall with right hip pain.  Patient states that he was walking his dog and tripped and fell onto the right hip.  He also did hit his head on the door as well.  Patient was noted to be hypoxic and tachycardic on arrival.  Patient states that he is wearing oxygen and denies any worsening shortness of breath.  Patient was noted to have right hip deformity. Denies any fevers or chills.  Denies any Covid exposure.  He did not get the vaccine yet.  The history is provided by the patient.       Past Medical History:  Diagnosis Date  . COPD (chronic obstructive pulmonary disease) (HCC)     There are no problems to display for this patient.   History reviewed. No pertinent surgical history.     No family history on file.  Social History   Tobacco Use  . Smoking status: Never Smoker  . Smokeless tobacco: Never Used  Substance Use Topics  . Alcohol use: Not Currently  . Drug use: Not Currently    Home Medications Prior to Admission medications   Not on File    Allergies    Patient has no known allergies.  Review of Systems   Review of Systems  Musculoskeletal:       R hip pain   All other systems reviewed and are negative.   Physical Exam Updated Vital Signs BP (!) 191/77 (BP Location: Left Arm)   Pulse (!) 110   Temp 97.6 F (36.4 C)   Resp 20   Ht 5\' 7"  (1.702 m)   Wt 46.3 kg   SpO2 94%   BMI 15.98 kg/m   Physical Exam Vitals and nursing note reviewed.  Constitutional:      Comments: Uncomfortable, chronically ill   HENT:     Head:     Comments: No obvious scalp hematoma or laceration     Nose: Nose normal.     Mouth/Throat:   Mouth: Mucous membranes are moist.  Eyes:     Extraocular Movements: Extraocular movements intact.     Pupils: Pupils are equal, round, and reactive to light.  Cardiovascular:     Rate and Rhythm: Regular rhythm. Tachycardia present.     Pulses: Normal pulses.     Comments: Tachycardic  Pulmonary:     Comments: Diminished throughout  Abdominal:     General: Abdomen is flat.     Palpations: Abdomen is soft.  Musculoskeletal:     Cervical back: Normal range of motion.     Comments: Obvious R hip deformity. No spinal tenderness. 2+ pulses bilateral DP   Skin:    General: Skin is warm.     Capillary Refill: Capillary refill takes less than 2 seconds.  Neurological:     General: No focal deficit present.  Psychiatric:        Mood and Affect: Mood normal.     ED Results / Procedures / Treatments   Labs (all labs ordered are listed, but only abnormal results are displayed) Labs Reviewed  CBC WITH DIFFERENTIAL/PLATELET - Abnormal; Notable for the following components:      Result  Value   WBC 14.2 (*)    RBC 3.13 (*)    Hemoglobin 10.2 (*)    HCT 34.2 (*)    MCV 109.3 (*)    MCHC 29.8 (*)    Neutro Abs 11.9 (*)    Abs Immature Granulocytes 0.11 (*)    All other components within normal limits  RESPIRATORY PANEL BY RT PCR (FLU A&B, COVID)  PROTIME-INR  COMPREHENSIVE METABOLIC PANEL  BLOOD GAS, VENOUS  TYPE AND SCREEN  TROPONIN I (HIGH SENSITIVITY)    EKG EKG Interpretation  Date/Time:  Saturday August 16 2019 19:18:01 EDT Ventricular Rate:  110 PR Interval:    QRS Duration: 150 QT Interval:  338 QTC Calculation: 458 R Axis:   -77 Text Interpretation: Sinus tachycardia Multiple premature complexes, vent & supraven RBBB and LAFB Artifact in lead(s) I III aVR aVL V2 V3 V6 Since last tracing rate faster Confirmed by Richardean Canal [phone]) on 08/16/2019 7:40:04 PM   Radiology No results found.  Procedures Procedures (including critical care time)  CRITICAL  CARE Performed by: Richardean Canal   Total critical care time: 30 minutes  Critical care time was exclusive of separately billable procedures and treating other patients.  Critical care was necessary to treat or prevent imminent or life-threatening deterioration.  Critical care was time spent personally by me on the following activities: development of treatment plan with patient and/or surrogate as well as nursing, discussions with consultants, evaluation of patient's response to treatment, examination of patient, obtaining history from patient or surrogate, ordering and performing treatments and interventions, ordering and review of laboratory studies, ordering and review of radiographic studies, pulse oximetry and re-evaluation of patient's condition.   Medications Ordered in ED Medications  fentaNYL (SUBLIMAZE) injection 25 mcg (has no administration in time range)  sodium chloride 0.9 % bolus 1,000 mL (has no administration in time range)    ED Course  I have reviewed the triage vital signs and the nursing notes.  Pertinent labs & imaging results that were available during my care of the patient were reviewed by me and considered in my medical decision making (see chart for details).    MDM Rules/Calculators/A&P                      Thomas Nicholson is a 86 y.o. male here with fall, R hip pain.  Patient also hypoxic and tachycardic.  He does have a history of COPD and is on oxygen at baseline.  He has no obvious right rib deformity and has no chest injury.  Will get CT head and neck and chest x-ray right hip x-ray.  Will get preop labs are as well.  9 pm X-ray showed right intertrochanteric fracture.  Consulted Dr. Odis Luster from Ortho.  His chest x-ray showed possible free air under the diaphragm.  He is still tachycardic and hypoxic.  Will get CTA chest and CT abdomen pelvis to further assess.  10:38 PM CT shows pneumonia.  There is no intra-abdominal process such as constipation.   Ordered blood cultures and antibiotics.  Hospitalist to admit for hypoxia from pneumonia, hip fracture.  Final Clinical Impression(s) / ED Diagnoses Final diagnoses:  None    Rx / DC Orders ED Discharge Orders    None       Charlynne Pander, MD 08/16/19 2239

## 2019-08-16 NOTE — ED Triage Notes (Signed)
Pt to ED by EMS after unwitnessed fall while walking his dog. Pt was found outside by EMS and states he was outside for approx 30 mins. Pt's temp upon arrival 97.6 oral. Pt with c/o of right hip pain. Per EMS shortening of right leg and swelling in right foot noted.

## 2019-08-17 ENCOUNTER — Inpatient Hospital Stay: Payer: Medicare Other | Admitting: Anesthesiology

## 2019-08-17 ENCOUNTER — Inpatient Hospital Stay
Admit: 2019-08-17 | Discharge: 2019-08-17 | Disposition: A | Discharge status: Home / Self Care | Payer: Medicare Other | Attending: Internal Medicine | Admitting: Internal Medicine

## 2019-08-17 ENCOUNTER — Inpatient Hospital Stay: Payer: Medicare Other

## 2019-08-17 ENCOUNTER — Other Ambulatory Visit: Payer: Self-pay

## 2019-08-17 ENCOUNTER — Encounter
Admission: EM | Disposition: A | Discharge status: Skilled Nursing Facility | Payer: Self-pay | Source: Home / Self Care | Attending: Internal Medicine

## 2019-08-17 ENCOUNTER — Encounter: Payer: Self-pay | Admitting: Internal Medicine

## 2019-08-17 DIAGNOSIS — S72141A Displaced intertrochanteric fracture of right femur, initial encounter for closed fracture: Principal | ICD-10-CM

## 2019-08-17 DIAGNOSIS — Z01810 Encounter for preprocedural cardiovascular examination: Secondary | ICD-10-CM

## 2019-08-17 DIAGNOSIS — R339 Retention of urine, unspecified: Secondary | ICD-10-CM

## 2019-08-17 DIAGNOSIS — R9431 Abnormal electrocardiogram [ECG] [EKG]: Secondary | ICD-10-CM

## 2019-08-17 HISTORY — PX: INTRAMEDULLARY (IM) NAIL INTERTROCHANTERIC: SHX5875

## 2019-08-17 LAB — URINALYSIS, COMPLETE (UACMP) WITH MICROSCOPIC
Bilirubin Urine: NEGATIVE
Glucose, UA: NEGATIVE mg/dL
Ketones, ur: NEGATIVE mg/dL
Nitrite: POSITIVE — AB
Protein, ur: 30 mg/dL — AB
RBC / HPF: >50 RBC/hpf — ABNORMAL HIGH (ref 0–5)
Specific Gravity, Urine: 1.03 (ref 1.005–1.030)
Squamous Epithelial / LPF: NONE SEEN (ref 0–5)
WBC, UA: >50 WBC/hpf — ABNORMAL HIGH (ref 0–5)
pH: 5 (ref 5.0–8.0)

## 2019-08-17 LAB — ABO/RH: ABO/RH(D): O POS

## 2019-08-17 LAB — CBC
HCT: 30.8 % — ABNORMAL LOW (ref 39.0–52.0)
Hemoglobin: 9.2 g/dL — ABNORMAL LOW (ref 13.0–17.0)
MCH: 32.7 pg (ref 26.0–34.0)
MCHC: 29.9 g/dL — ABNORMAL LOW (ref 30.0–36.0)
MCV: 109.6 fL — ABNORMAL HIGH (ref 80.0–100.0)
Platelets: 154 10*3/uL (ref 150–400)
RBC: 2.81 MIL/uL — ABNORMAL LOW (ref 4.22–5.81)
RDW: 11.9 % (ref 11.5–15.5)
WBC: 11.4 10*3/uL — ABNORMAL HIGH (ref 4.0–10.5)
nRBC: 0 % (ref 0.0–0.2)

## 2019-08-17 LAB — ECHOCARDIOGRAM COMPLETE
Height: 67 in
Weight: 1632 oz

## 2019-08-17 LAB — SURGICAL PCR SCREEN
MRSA, PCR: NEGATIVE
Staphylococcus aureus: NEGATIVE

## 2019-08-17 LAB — GLUCOSE, CAPILLARY: Glucose-Capillary: 105 mg/dL — ABNORMAL HIGH (ref 70–99)

## 2019-08-17 LAB — HIV ANTIBODY (ROUTINE TESTING W REFLEX): HIV Screen 4th Generation wRfx: NONREACTIVE

## 2019-08-17 SURGERY — FIXATION, FRACTURE, INTERTROCHANTERIC, WITH INTRAMEDULLARY ROD
Anesthesia: Spinal | Site: Hip | Laterality: Right

## 2019-08-17 MED ORDER — FENTANYL CITRATE (PF) 100 MCG/2ML IJ SOLN: 25.0000 ug | INTRAMUSCULAR | Status: DC | PRN

## 2019-08-17 MED ORDER — DEXAMETHASONE SODIUM PHOSPHATE 10 MG/ML IJ SOLN
INTRAMUSCULAR | Status: DC | PRN
  Administered 2019-08-17: 10 mg via INTRAVENOUS

## 2019-08-17 MED ORDER — SODIUM CHLORIDE 0.9% IV SOLUTION: Freq: Once | INTRAVENOUS | Status: AC

## 2019-08-17 MED ORDER — CHLORHEXIDINE GLUCONATE CLOTH 2 % EX PADS
6.0000 | MEDICATED_PAD | Freq: Every day | CUTANEOUS | Status: DC
  Administered 2019-08-19 – 2019-08-23 (×5): 6 via TOPICAL

## 2019-08-17 MED ORDER — VASOPRESSIN 20 UNIT/ML IV SOLN
INTRAVENOUS | Status: DC | PRN
  Administered 2019-08-17 (×3): 1 [IU] via INTRAVENOUS
  Administered 2019-08-17 (×2): 2 [IU] via INTRAVENOUS

## 2019-08-17 MED ORDER — METOPROLOL SUCCINATE ER 25 MG PO TB24: 12.5000 mg | ORAL_TABLET | Freq: Every day | ORAL | Status: DC

## 2019-08-17 MED ORDER — DOCUSATE SODIUM 100 MG PO CAPS
100.0000 mg | ORAL_CAPSULE | Freq: Two times a day (BID) | ORAL | Status: DC
  Administered 2019-08-18 – 2019-08-24 (×12): 100 mg via ORAL
  Filled 2019-08-17 (×12): qty 1

## 2019-08-17 MED ORDER — PHENYLEPHRINE HCL (PRESSORS) 10 MG/ML IV SOLN
INTRAVENOUS | Status: DC | PRN
  Administered 2019-08-17: 100 ug via INTRAVENOUS
  Administered 2019-08-17: 200 ug via INTRAVENOUS

## 2019-08-17 MED ORDER — ALBUMIN HUMAN 5 % IV SOLN: INTRAVENOUS | Status: DC | PRN

## 2019-08-17 MED ORDER — MUPIROCIN 2 % EX OINT
1.0000 "application " | TOPICAL_OINTMENT | Freq: Two times a day (BID) | CUTANEOUS | Status: DC
  Filled 2019-08-17: qty 22

## 2019-08-17 MED ORDER — ALBUMIN HUMAN 5 % IV SOLN
INTRAVENOUS | Status: AC
  Filled 2019-08-17: qty 250

## 2019-08-17 MED ORDER — KETAMINE HCL 50 MG/ML IJ SOLN
INTRAMUSCULAR | Status: AC
  Filled 2019-08-17: qty 10

## 2019-08-17 MED ORDER — BUPIVACAINE HCL (PF) 0.5 % IJ SOLN
INTRAMUSCULAR | Status: DC | PRN
  Administered 2019-08-17: 2.8 mL

## 2019-08-17 MED ORDER — KETAMINE HCL 100 MG/ML IJ SOLN
INTRAMUSCULAR | Status: DC | PRN
  Administered 2019-08-17: 50 mg via INTRAVENOUS

## 2019-08-17 MED ORDER — PROPOFOL 10 MG/ML IV BOLUS
INTRAVENOUS | Status: DC | PRN
  Administered 2019-08-17: 20 mg via INTRAVENOUS

## 2019-08-17 MED ORDER — SODIUM CHLORIDE 0.9 % IV SOLN: INTRAVENOUS | Status: DC | PRN

## 2019-08-17 MED ORDER — ONDANSETRON HCL 4 MG/2ML IJ SOLN
4.0000 mg | Freq: Four times a day (QID) | INTRAMUSCULAR | Status: DC | PRN
  Filled 2019-08-17: qty 2

## 2019-08-17 MED ORDER — ALBUMIN HUMAN 25 % IV SOLN
INTRAVENOUS | Status: AC
  Filled 2019-08-17: qty 200

## 2019-08-17 MED ORDER — PROPOFOL 500 MG/50ML IV EMUL
INTRAVENOUS | Status: DC | PRN
  Administered 2019-08-17: 50 ug/kg/min via INTRAVENOUS

## 2019-08-17 MED ORDER — EPHEDRINE SULFATE 50 MG/ML IJ SOLN
INTRAMUSCULAR | Status: DC | PRN
  Administered 2019-08-17 (×2): 15 mg via INTRAVENOUS

## 2019-08-17 MED ORDER — DEXTROSE 5 % IV SOLN
250.0000 mg | INTRAVENOUS | Status: DC
  Administered 2019-08-18 – 2019-08-19 (×2): 250 mg via INTRAVENOUS
  Filled 2019-08-17 (×4): qty 250

## 2019-08-17 MED ORDER — FENTANYL CITRATE (PF) 100 MCG/2ML IJ SOLN
INTRAMUSCULAR | Status: AC
  Filled 2019-08-17: qty 2

## 2019-08-17 MED ORDER — ALBUMIN HUMAN 25 % IV SOLN: INTRAVENOUS | Status: DC | PRN

## 2019-08-17 MED ORDER — SODIUM CHLORIDE 0.9 % IV SOLN
INTRAVENOUS | Status: DC | PRN
  Administered 2019-08-17: 50 ug/min via INTRAVENOUS

## 2019-08-17 MED ORDER — TRANEXAMIC ACID 1000 MG/10ML IV SOLN
INTRAVENOUS | Status: DC | PRN
  Administered 2019-08-17: 1000 mg via INTRAVENOUS

## 2019-08-17 MED ORDER — SODIUM CHLORIDE FLUSH 0.9 % IV SOLN
INTRAVENOUS | Status: AC
  Administered 2019-08-18: 10 mL
  Filled 2019-08-17: qty 10

## 2019-08-17 MED ORDER — ONDANSETRON HCL 4 MG/2ML IJ SOLN
INTRAMUSCULAR | Status: DC | PRN
  Administered 2019-08-17: 4 mg via INTRAVENOUS

## 2019-08-17 MED ORDER — TRANEXAMIC ACID 1000 MG/10ML IV SOLN
2000.0000 mg | Freq: Once | INTRAVENOUS | Status: DC
  Filled 2019-08-17: qty 20

## 2019-08-17 MED ORDER — SODIUM CHLORIDE 0.9% IV SOLUTION: Freq: Once | INTRAVENOUS | Status: DC

## 2019-08-17 MED ORDER — METOCLOPRAMIDE HCL 5 MG/ML IJ SOLN: 5.0000 mg | Freq: Three times a day (TID) | INTRAMUSCULAR | Status: DC | PRN

## 2019-08-17 MED ORDER — METOCLOPRAMIDE HCL 10 MG PO TABS
5.0000 mg | ORAL_TABLET | Freq: Three times a day (TID) | ORAL | Status: DC | PRN
  Filled 2019-08-17: qty 1

## 2019-08-17 MED ORDER — ONDANSETRON HCL 4 MG PO TABS: 4.0000 mg | ORAL_TABLET | Freq: Four times a day (QID) | ORAL | Status: DC | PRN

## 2019-08-17 MED ORDER — ONDANSETRON HCL 4 MG/2ML IJ SOLN: 4.0000 mg | Freq: Once | INTRAMUSCULAR | Status: DC | PRN

## 2019-08-17 MED ORDER — LIDOCAINE HCL URETHRAL/MUCOSAL 2 % EX GEL
1.0000 "application " | Freq: Once | CUTANEOUS | Status: AC
  Administered 2019-08-17: 1 via URETHRAL
  Filled 2019-08-17: qty 10

## 2019-08-17 MED ORDER — FENTANYL CITRATE (PF) 100 MCG/2ML IJ SOLN
INTRAMUSCULAR | Status: AC
  Administered 2019-08-17: 25 ug via INTRAVENOUS
  Filled 2019-08-17: qty 2

## 2019-08-17 MED ORDER — PROPOFOL 500 MG/50ML IV EMUL
INTRAVENOUS | Status: AC
  Filled 2019-08-17: qty 50

## 2019-08-17 SURGICAL SUPPLY — 47 items
BLADE TFNA HELICA 100 (Anchor) ×3 IMPLANT
BNDG COHESIVE 4X5 TAN STRL (GAUZE/BANDAGES/DRESSINGS) IMPLANT
BNDG COHESIVE 6X5 TAN STRL LF (GAUZE/BANDAGES/DRESSINGS) ×3 IMPLANT
BRUSH SCRUB EZ  4% CHG (MISCELLANEOUS) ×4
BRUSH SCRUB EZ 4% CHG (MISCELLANEOUS) ×2 IMPLANT
CANISTER SUCT 1200ML W/VALVE (MISCELLANEOUS) ×3 IMPLANT
CHLORAPREP W/TINT 26 (MISCELLANEOUS) ×3 IMPLANT
COVER WAND RF STERILE (DRAPES) IMPLANT
DRAPE 3/4 80X56 (DRAPES) ×3 IMPLANT
DRAPE U-SHAPE 47X51 STRL (DRAPES) ×3 IMPLANT
DRSG AQUACEL AG ADV 3.5X 4 (GAUZE/BANDAGES/DRESSINGS) IMPLANT
DRSG AQUACEL AG ADV 3.5X10 (GAUZE/BANDAGES/DRESSINGS) ×3 IMPLANT
ELECT REM PT RETURN 9FT ADLT (ELECTROSURGICAL) ×3
ELECTRODE REM PT RTRN 9FT ADLT (ELECTROSURGICAL) ×1 IMPLANT
GAUZE XEROFORM 1X8 LF (GAUZE/BANDAGES/DRESSINGS) ×3 IMPLANT
GLOVE BIO SURGEON STRL SZ7.5 (GLOVE) ×6 IMPLANT
GLOVE INDICATOR 8.0 STRL GRN (GLOVE) ×9 IMPLANT
GLOVE SURG ORTHO 8.0 STRL STRW (GLOVE) ×6 IMPLANT
GOWN STRL REUS W/ TWL LRG LVL3 (GOWN DISPOSABLE) ×2 IMPLANT
GOWN STRL REUS W/ TWL XL LVL3 (GOWN DISPOSABLE) ×1 IMPLANT
GOWN STRL REUS W/TWL LRG LVL3 (GOWN DISPOSABLE) ×4
GOWN STRL REUS W/TWL XL LVL3 (GOWN DISPOSABLE) ×2
HANDLE YANKAUER SUCT BULB TIP (MISCELLANEOUS) ×3 IMPLANT
KIT PATIENT CARE HANA TABLE (KITS) IMPLANT
KIT TURNOVER CYSTO (KITS) ×3 IMPLANT
LOOP RED MAXI  1X406MM (MISCELLANEOUS) ×2
LOOP VESSEL MAXI 1X406 RED (MISCELLANEOUS) ×1 IMPLANT
LOOP VESSEL MINI 0.8X406 BLUE (MISCELLANEOUS) ×1 IMPLANT
LOOPS BLUE MINI 0.8X406MM (MISCELLANEOUS) ×2
MAT ABSORB  FLUID 56X50 GRAY (MISCELLANEOUS) ×2
MAT ABSORB FLUID 56X50 GRAY (MISCELLANEOUS) ×1 IMPLANT
NAIL CAN TFNA 9 130D 380 RT (Nail) ×3 IMPLANT
NEEDLE SPNL 20GX3.5 QUINCKE YW (NEEDLE) ×3 IMPLANT
NS IRRIG 1000ML POUR BTL (IV SOLUTION) ×3 IMPLANT
PACK HIP COMPR (MISCELLANEOUS) ×3 IMPLANT
SPONGE LAP 18X18 RF (DISPOSABLE) ×6 IMPLANT
STAPLER SKIN PROX 35W (STAPLE) ×3 IMPLANT
SUT VIC AB 0 CT1 36 (SUTURE) ×3 IMPLANT
SUT VIC AB 2-0 CT1 27 (SUTURE) ×2
SUT VIC AB 2-0 CT1 TAPERPNT 27 (SUTURE) ×1 IMPLANT
SUT VICRYL 2 0 18  UND BR (SUTURE) ×2
SUT VICRYL 2 0 18 UND BR (SUTURE) ×1 IMPLANT
SYR 30ML LL (SYRINGE) ×3 IMPLANT
TOWEL OR 17X26 4PK STRL BLUE (TOWEL DISPOSABLE) ×3 IMPLANT
TUBING CONNECTING 10 (TUBING) ×2 IMPLANT
TUBING CONNECTING 10' (TUBING) ×1
vicryl ×3 IMPLANT
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
## 2019-08-17 NOTE — Progress Notes (Signed)
OT Cancellation Note  Patient Details Name: BRON SNELLINGS MRN: [id number] DOB: 02-25-1933   Cancelled Treatment:    Reason Eval/Treat Not Completed: Patient not medically ready Thank you for the OT consult. Order received and chart reviewed. Patient with femur fracture, per ortho consult is pending operative procedure later this date. Will discontinue OT orders, please place new orders/consult as appropriate after patient has surgery.   Kathie Dike, M.S. OTR/L  08/17/19, 2:41 PM

## 2019-08-17 NOTE — ED Notes (Signed)
Received verbal orders from Dr Damita Dunnings to place foley catheter. Attempted to place 27f without success. Attempted again with 166fcoude catheter and again met resistance. Dr BrOwens Sharkrdered a urojet and attempted to place a coude without success. House supervisor contacted to bring urology cart.

## 2019-08-17 NOTE — ED Notes (Signed)
Bladder scan greater then 324 ml.

## 2019-08-17 NOTE — Op Note (Signed)
    Patient name: Thomas Nicholson MRN: [id number] DOB: 07-Jun-1932 Sex: male  08/17/2019 Pre-operative Diagnosis: Bleeding during right IM nail placement Post-operative diagnosis:  Same Surgeon:  Durene Cal Co-surgeon: Dr. Odis Luster Procedure:   Ligation of arterial bleed, likely profunda branch Anesthesia: General Blood Loss: See anesthesia record Specimens: None  Findings: Once I had arrived the bleeding appeared to be controlled.  I did identify a profunda branch that was the likely source of bleeding which was ligated with a 2-0 Vicryl tie  Indications: I was asked by Dr. Odis Luster to come evaluate bleeding following placement of a IM right femoral nail.  The bleeding was felt to be arterial  Procedure:  The patient was identified in the holding area and taken to [facility] OR ROOM 07  The patient was then placed supine on the table. general anesthesia was administered.  The patient was prepped and draped in the usual sterile fashion.  A time out was called and antibiotics were administered.  Upon my arrival, I explored the lateral incision.  At this time the area of concern appeared to have stopped bleeding.  I explored the wound and identified what I felt to be a profunda branch and its associated vein.  I ligated this with a 2-0 Vicryl tie.  We observe the wound for a while and explored the exposed area and there was no further bleeding.   Disposition: To PACU stable.  I evaluated the patient in the PACU and he had a brisk right posterior tibial Doppler signal.   Juleen China, M.D., [facility] [place] Office: [phone] Pager:  [phone]

## 2019-08-17 NOTE — ED Notes (Signed)
Pt trial on 6L via nasal cannula.

## 2019-08-17 NOTE — Progress Notes (Signed)
PROGRESS NOTE    Thomas Nicholson  QQV:[id number] DOB: [birth year] DOA: 08/16/2019 PCP: Patient, No Pcp Per    Brief Narrative:  Thomas Nicholson is a 86 y.o. male with medical history significant for COPD on home O2 at 2 L and rheumatoid arthritis brought in following fall onto his right side while walking his dog.  He hit his head on the door but did not lose consciousness.  States he was trying to open the door but this spring to the door was broken and he ended up falling. On arrival in the emergency room he was noted to be tachycardic at 110 as well as tachypneic.  He was also hypoxic, requiring up to 5 L to keep sats in the low 90s.  His blood work was for the most part unremarkable.  Hip x-ray showed right intertrochanteric fracture.  Because of tachycardia and tachypnea he had a CTA chest that was negative for PE but showed right middle lobe pneumonia as well as a right upper lobe pulmonary nodule and several other nonacute findings.  The emergency room provider spoke with orthopedist Dr. Harlow Mares.  He was started on IV antibiotics for pneumonia and duo nebs covid negative.   Consultants:   Ortho , cardiology  Procedures:   Antimicrobials:   Ceftriaxone Rocephin   Subjective: Complaining of mouth dry, no other complaints Sinus tachy with pac, pvc  Objective: Vitals:   08/17/19 0700 08/17/19 0830 08/17/19 0945 08/17/19 1312  BP: 136/79 118/61 123/77 127/62  Pulse: (!) 120 69 76 94  Resp: 17 (!) 21 18 16   Temp:   98.6 F (37 C) (!) 97.5 F (36.4 C)  TempSrc:   Oral Oral  SpO2: 94% 96% 98% 95%  Weight:      Height:        Intake/Output Summary (Last 24 hours) at 08/17/2019 1514 Last data filed at 08/17/2019 0045 Gross per 24 hour  Intake 1100 ml  Output --  Net 1100 ml   Filed Weights   08/16/19 1912  Weight: 46.3 kg    Examination:  General exam: Appears calm and comfortable, NAD, using neb treatment  respiratory system: Clear to auscultation. Respiratory  effort normal. Cardiovascular system: S1 & S2 heard, RRR. No JVD, murmurs, rubs, gallops or clicks.  Gastrointestinal system: Abdomen is nondistended, soft and nontender. Normal bowel sounds heard. Central nervous system: Alert and oriented.  Extremities: no edema Skin: Warm dry Psychiatry: Judgement and insight appear normal. Mood & affect appropriate.     Data Reviewed: I have personally reviewed following labs and imaging studies  CBC: Recent Labs  Lab 08/16/19 1931 08/17/19 0742  WBC 14.2* 11.4*  NEUTROABS 11.9*  --   HGB 10.2* 9.2*  HCT 34.2* 30.8*  MCV 109.3* 109.6*  PLT 219 332   Basic Metabolic Panel: Recent Labs  Lab 08/16/19 1931  NA 140  K 4.4  CL 98  CO2 33*  GLUCOSE 176*  BUN 22  CREATININE 1.25*  CALCIUM 9.4   GFR: Estimated Creatinine Clearance: 28.8 mL/min (A) (by C-G formula based on SCr of 1.25 mg/dL (H)). Liver Function Tests: Recent Labs  Lab 08/16/19 1931  AST 19  ALT 14  ALKPHOS 83  BILITOT 0.6  PROT 6.4*  ALBUMIN 3.1*   No results for input(s): LIPASE, AMYLASE in the last 168 hours. No results for input(s): AMMONIA in the last 168 hours. Coagulation Profile: Recent Labs  Lab 08/16/19 1931  INR 1.0   Cardiac Enzymes: No results  for input(s): CKTOTAL, CKMB, CKMBINDEX, TROPONINI in the last 168 hours. BNP (last 3 results) No results for input(s): PROBNP in the last 8760 hours. HbA1C: No results for input(s): HGBA1C in the last 72 hours. CBG: Recent Labs  Lab 08/17/19 0801  GLUCAP 105*   Lipid Profile: No results for input(s): CHOL, HDL, LDLCALC, TRIG, CHOLHDL, LDLDIRECT in the last 72 hours. Thyroid Function Tests: No results for input(s): TSH, T4TOTAL, FREET4, T3FREE, THYROIDAB in the last 72 hours. Anemia Panel: No results for input(s): VITAMINB12, FOLATE, FERRITIN, TIBC, IRON, RETICCTPCT in the last 72 hours. Sepsis Labs: No results for input(s): PROCALCITON, LATICACIDVEN in the last 168 hours.  Recent Results (from  the past 240 hour(s))  Respiratory Panel by RT PCR (Flu A&B, Covid) - Nasopharyngeal Swab     Status: None   Collection Time: 08/16/19 10:22 PM   Specimen: Nasopharyngeal Swab  Result Value Ref Range Status   SARS Coronavirus 2 by RT PCR NEGATIVE NEGATIVE Final    Comment: (NOTE) SARS-CoV-2 target nucleic acids are NOT DETECTED. The SARS-CoV-2 RNA is generally detectable in upper respiratoy specimens during the acute phase of infection. The lowest concentration of SARS-CoV-2 viral copies this assay can detect is 131 copies/mL. A negative result does not preclude SARS-Cov-2 infection and should not be used as the sole basis for treatment or other patient management decisions. A negative result may occur with  improper specimen collection/handling, submission of specimen other than nasopharyngeal swab, presence of viral mutation(s) within the areas targeted by this assay, and inadequate number of viral copies (<131 copies/mL). A negative result must be combined with clinical observations, patient history, and epidemiological information. The expected result is Negative. Fact Sheet for Patients:  https://www.moore.com/ Fact Sheet for Healthcare Providers:  https://www.young.biz/ This test is not yet ap proved or cleared by the [place] FDA and  has been authorized for detection and/or diagnosis of SARS-CoV-2 by FDA under an Emergency Use Authorization (EUA). This EUA will remain  in effect (meaning this test can be used) for the duration of the COVID-19 declaration under Section 564(b)(1) of the Act, 21 U.S.C. section 360bbb-3(b)(1), unless the authorization is terminated or revoked sooner.    Influenza A by PCR NEGATIVE NEGATIVE Final   Influenza B by PCR NEGATIVE NEGATIVE Final    Comment: (NOTE) The Xpert Xpress SARS-CoV-2/FLU/RSV assay is intended as an aid in  the diagnosis of influenza from Nasopharyngeal swab specimens and  should  not be used as a sole basis for treatment. Nasal washings and  aspirates are unacceptable for Xpert Xpress SARS-CoV-2/FLU/RSV  testing. Fact Sheet for Patients: https://www.moore.com/ Fact Sheet for Healthcare Providers: https://www.young.biz/ This test is not yet approved or cleared by the [place] FDA and  has been authorized for detection and/or diagnosis of SARS-CoV-2 by  FDA under an Emergency Use Authorization (EUA). This EUA will remain  in effect (meaning this test can be used) for the duration of the  Covid-19 declaration under Section 564(b)(1) of the Act, 21  U.S.C. section 360bbb-3(b)(1), unless the authorization is  terminated or revoked. Performed at [facility], [address] [place], [place] [address]   Blood culture (routine x 2)     Status: None (Preliminary result)   Collection Time: 08/17/19 12:03 AM   Specimen: BLOOD  Result Value Ref Range Status   Specimen Description BLOOD BLOOD LEFT FOREARM  Final   Special Requests   Final    BOTTLES DRAWN AEROBIC AND ANAEROBIC Blood Culture adequate volume  Culture   Final    NO GROWTH < 12 HOURS Performed at [facility] [address] [place], [place] [address]    Report Status PENDING  Incomplete  Blood culture (routine x 2)     Status: None (Preliminary result)   Collection Time: 08/17/19 12:03 AM   Specimen: BLOOD  Result Value Ref Range Status   Specimen Description BLOOD LEFT ANTECUBITAL  Final   Special Requests   Final    BOTTLES DRAWN AEROBIC AND ANAEROBIC Blood Culture adequate volume   Culture   Final    NO GROWTH < 12 HOURS Performed at [facility], [address] [place], [place] [address]    Report Status PENDING  Incomplete  Surgical PCR screen     Status: None   Collection Time: 08/17/19  1:31 PM   Specimen: Nasal Mucosa; Nasal Swab  Result Value Ref Range Status   MRSA, PCR NEGATIVE NEGATIVE Final   Staphylococcus  aureus NEGATIVE NEGATIVE Final    Comment: (NOTE) The Xpert SA Assay (FDA approved for NASAL specimens in patients 49 years of age and older), is one component of a comprehensive surveillance program. It is not intended to diagnose infection nor to guide or monitor treatment. Performed at [facility], [address], [place], [place] [address]          Radiology Studies: DG Chest 1 View  Result Date: 08/16/2019 CLINICAL DATA:  Pt to ED by EMS after unwitnessed fall while walking his dog. Pt was found outside by EMS and states he was outside for approx 30 mins. EXAM: CHEST  1 VIEW COMPARISON:  Chest radiograph 11/16/2005 FINDINGS: Tortuous thoracic aorta. Normal heart size. Lungs are hyperinflated. Linear opacities at the right lung base likely reflect scarring or atelectasis. Small right pleural effusion or pleural thickening. There is an area of lucency under the central hemidiaphragm of uncertain etiology. IMPRESSION: 1. Lucency under the central hemidiaphragm of uncertain etiology. Recommend abdominal radiographs to exclude free air. 2. Small right pleural effusion or pleural thickening. Minimal opacities at the right lung base likely reflect atelectasis or scarring. 3. COPD. Electronically Signed   By: Emmaline Kluver M.D.   On: 08/16/2019 20:30   CT Head Wo Contrast  Result Date: 08/16/2019 CLINICAL DATA:  Fall while walking dog, found down after 30 minutes EXAM: CT HEAD WITHOUT CONTRAST CT CERVICAL SPINE WITHOUT CONTRAST TECHNIQUE: Multidetector CT imaging of the head and cervical spine was performed following the standard protocol without intravenous contrast. Multiplanar CT image reconstructions of the cervical spine were also generated. COMPARISON:  Cervical spine MRI January 04, 2006 FINDINGS: CT HEAD FINDINGS Brain: No evidence of acute infarction, hemorrhage, hydrocephalus, extra-axial collection or mass lesion/mass effect. Symmetric prominence of the ventricles, cisterns  and sulci compatible with parenchymal volume loss. Patchy areas of white matter hypoattenuation are most compatible with chronic microvascular angiopathy. Vascular: Atherosclerotic calcification of the carotid siphons and intradural vertebral arteries. No hyperdense vessel. Skull: No calvarial fracture or suspicious osseous lesion. No scalp swelling or hematoma. Sinuses/Orbits: Paranasal sinuses and mastoid air cells are predominantly clear. Included orbital structures are unremarkable. Benign senescent scleral plaque. Other: Age-indeterminate deformities of the bilateral nasal bones, favor chronic in the absence of swelling. CT CERVICAL SPINE FINDINGS Alignment: Straightening of normal cervical lordosis. Bony fusion across the C5-6 vertebral bodies. Partial fusion across the C5-6 facets on the left as well. Mild anterolisthesis of C4 on 5 is likely on a degenerative basis reflecting some adjacent segment  disease. Appearance is similar to comparison MRI from January 04, 2006. No abnormally widened, jumped or perched facets. Skull base and vertebrae: Fusion of C5-6, as above. The osseous structures appear diffusely demineralized which may limit detection of small or nondisplaced fractures. No definite acute fracture. No primary bone lesion or focal pathologic process. Multilevel Schmorl's node formations are noted. Soft tissues and spinal canal: No pre or paravertebral fluid or swelling. No visible canal hematoma. Disc levels: Advanced multilevel cervical spondylitic and facet degenerative changes throughout the cervical spine. Subchondral sclerosis and cystic change of the dens at the atlantodental articulation. Mild-to-moderate canal stenoses are noted at C4-5 and C6-7. Additional uncinate spurring and advanced hypertrophic facet degenerative change results in mild-to-moderate multilevel neural foraminal narrowing as well. Upper chest: Extensive severe centrilobular and paraseptal emphysematous changes with some  larger apical bulla. Coronal narrowing of the trachea, likely reflective of COPD, with scattered secretions. Other: Normal thyroid. Cervical carotid atherosclerosis. IMPRESSION: 1. No acute intracranial abnormality. 2. Small vessel ischemic disease and parenchymal volume loss. 3. Age-indeterminate deformities of the bilateral nasal bones, favor chronic in the absence of swelling. Correlate for point tenderness. 4. No acute fracture or traumatic listhesis of the cervical spine. 5. Advanced multilevel cervical spondylitic and facet degenerative changes. 6.  Emphysema (ICD10-J43.9) and features of COPD. 7. Cervical and intracranial atherosclerosis. Electronically Signed   By: Kreg Shropshire M.D.   On: 08/16/2019 20:07   CT Angio Chest PE W and/or Wo Contrast  Result Date: 08/16/2019 CLINICAL DATA:  Acute pain due to trauma EXAM: CT CHEST, ABDOMEN, AND PELVIS WITH CONTRAST TECHNIQUE: Multidetector CT imaging of the chest, abdomen and pelvis was performed following the standard protocol during bolus administration of intravenous contrast. CONTRAST:  75mL OMNIPAQUE IOHEXOL 350 MG/ML SOLN COMPARISON:  Oct 03, 2017. FINDINGS: CT CHEST FINDINGS Cardiovascular: Contrast injection is sufficient to demonstrate satisfactory opacification of the pulmonary arteries to the segmental level. There is no pulmonary embolus. The main pulmonary artery is dilated measuring up to approximately 3.2 cm. There is no CT evidence of acute right heart strain. There is extensive irregular calcified and noncalcified plaque at the aortic arch. The heart size is normal. Coronary artery calcifications are noted. Mediastinum/Nodes: --No mediastinal or hilar lymphadenopathy. --No axillary lymphadenopathy. --No supraclavicular lymphadenopathy. --Normal thyroid gland. --The esophagus is unremarkable Lungs/Pleura: There is bronchial wall thickening and mucus plugging at the lung bases bilaterally, right worse than left. There is some consolidation in the  medial right lower lobe. There is an airspace opacity in the medial right upper lobe measuring approximately 1.5 cm (axial series 6, image 30). This is new since 2008. There are severe emphysematous changes bilaterally. There is a 5 mm pulmonary nodule in the left lower lobe (axial series 4, image 83). This is stable since 2019 and is consistent with a benign process. There is a large calcified granuloma involving the posterior right lower lobe. The trachea is unremarkable. There is no pneumothorax. No significant pleural effusion. Musculoskeletal: There is mild height loss of the T3 vertebral body, likely chronic. There is mild height loss of the T9 vertebral body, likely chronic. Review of the MIP images confirms the above findings. CT ABDOMEN PELVIS FINDINGS Hepatobiliary: Multiple hepatic cysts are noted. These are relatively stable from prior study. Normal gallbladder.There is no biliary ductal dilation. Pancreas: Normal contours without ductal dilatation. No peripancreatic fluid collection. Spleen: No splenic laceration or hematoma. Adrenals/Urinary Tract: --Adrenal glands: No adrenal hemorrhage. --Right kidney/ureter: No hydronephrosis or perinephric hematoma. --Left kidney/ureter:  There are nonobstructing stones in the lower pole the left kidney. --Urinary bladder: Unremarkable. Stomach/Bowel: --Stomach/Duodenum: Stomach is distended with an air-fluid level. --Small bowel: No dilatation or inflammation. --Colon: There is a large amount of stool in the colon. --Appendix: Normal. Vascular/Lymphatic: Atherosclerotic calcification is present within the non-aneurysmal abdominal aorta, without hemodynamically significant stenosis. Is a probable high-grade stenosis involving the external iliac artery on the left (axial series 2, image 54). --No retroperitoneal lymphadenopathy. --No mesenteric lymphadenopathy. --No pelvic or inguinal lymphadenopathy. Reproductive: Unremarkable Other: No ascites or free air. The  abdominal wall is normal. Musculoskeletal. There is an acute displaced fracture of the proximal right femur. There are end-stage degenerative changes of both hips. IMPRESSION: 1. Acute displaced fracture of the proximal right femur. There are end-stage degenerative changes of both hips. 2. Small amount of consolidation in the medial right lower lobe with associated bronchial wall thickening and mucus plugging is suggestive of infectious or reactive bronchiolitis. 3. There is a 1.5 cm airspace opacity in the medial right upper lobe. This may represent an infiltrate, atelectasis, or pulmonary nodule. A three-month follow-up CT of the chest is recommended for further evaluation. 4. Severe emphysematous changes. 5. Large amount of stool in the colon may indicate constipation. 6. Suspected high-grade stenosis of the left external iliac artery. 7. Nonobstructive left lower pole stones. 8. Dilated main pulmonary artery which can be seen in patients with elevated pulmonary artery pressures. 9. Aortic Atherosclerosis (ICD10-I70.0) and Emphysema (ICD10-J43.9). Electronically Signed   By: Katherine Mantle M.D.   On: 08/16/2019 22:32   CT Cervical Spine Wo Contrast  Result Date: 08/16/2019 CLINICAL DATA:  Fall while walking dog, found down after 30 minutes EXAM: CT HEAD WITHOUT CONTRAST CT CERVICAL SPINE WITHOUT CONTRAST TECHNIQUE: Multidetector CT imaging of the head and cervical spine was performed following the standard protocol without intravenous contrast. Multiplanar CT image reconstructions of the cervical spine were also generated. COMPARISON:  Cervical spine MRI January 04, 2006 FINDINGS: CT HEAD FINDINGS Brain: No evidence of acute infarction, hemorrhage, hydrocephalus, extra-axial collection or mass lesion/mass effect. Symmetric prominence of the ventricles, cisterns and sulci compatible with parenchymal volume loss. Patchy areas of white matter hypoattenuation are most compatible with chronic microvascular  angiopathy. Vascular: Atherosclerotic calcification of the carotid siphons and intradural vertebral arteries. No hyperdense vessel. Skull: No calvarial fracture or suspicious osseous lesion. No scalp swelling or hematoma. Sinuses/Orbits: Paranasal sinuses and mastoid air cells are predominantly clear. Included orbital structures are unremarkable. Benign senescent scleral plaque. Other: Age-indeterminate deformities of the bilateral nasal bones, favor chronic in the absence of swelling. CT CERVICAL SPINE FINDINGS Alignment: Straightening of normal cervical lordosis. Bony fusion across the C5-6 vertebral bodies. Partial fusion across the C5-6 facets on the left as well. Mild anterolisthesis of C4 on 5 is likely on a degenerative basis reflecting some adjacent segment disease. Appearance is similar to comparison MRI from January 04, 2006. No abnormally widened, jumped or perched facets. Skull base and vertebrae: Fusion of C5-6, as above. The osseous structures appear diffusely demineralized which may limit detection of small or nondisplaced fractures. No definite acute fracture. No primary bone lesion or focal pathologic process. Multilevel Schmorl's node formations are noted. Soft tissues and spinal canal: No pre or paravertebral fluid or swelling. No visible canal hematoma. Disc levels: Advanced multilevel cervical spondylitic and facet degenerative changes throughout the cervical spine. Subchondral sclerosis and cystic change of the dens at the atlantodental articulation. Mild-to-moderate canal stenoses are noted at C4-5 and C6-7. Additional uncinate  spurring and advanced hypertrophic facet degenerative change results in mild-to-moderate multilevel neural foraminal narrowing as well. Upper chest: Extensive severe centrilobular and paraseptal emphysematous changes with some larger apical bulla. Coronal narrowing of the trachea, likely reflective of COPD, with scattered secretions. Other: Normal thyroid. Cervical  carotid atherosclerosis. IMPRESSION: 1. No acute intracranial abnormality. 2. Small vessel ischemic disease and parenchymal volume loss. 3. Age-indeterminate deformities of the bilateral nasal bones, favor chronic in the absence of swelling. Correlate for point tenderness. 4. No acute fracture or traumatic listhesis of the cervical spine. 5. Advanced multilevel cervical spondylitic and facet degenerative changes. 6.  Emphysema (ICD10-J43.9) and features of COPD. 7. Cervical and intracranial atherosclerosis. Electronically Signed   By: Kreg Shropshire M.D.   On: 08/16/2019 20:07   CT ABDOMEN PELVIS W CONTRAST  Result Date: 08/16/2019 CLINICAL DATA:  Acute pain due to trauma EXAM: CT CHEST, ABDOMEN, AND PELVIS WITH CONTRAST TECHNIQUE: Multidetector CT imaging of the chest, abdomen and pelvis was performed following the standard protocol during bolus administration of intravenous contrast. CONTRAST:  13mL OMNIPAQUE IOHEXOL 350 MG/ML SOLN COMPARISON:  Oct 03, 2017. FINDINGS: CT CHEST FINDINGS Cardiovascular: Contrast injection is sufficient to demonstrate satisfactory opacification of the pulmonary arteries to the segmental level. There is no pulmonary embolus. The main pulmonary artery is dilated measuring up to approximately 3.2 cm. There is no CT evidence of acute right heart strain. There is extensive irregular calcified and noncalcified plaque at the aortic arch. The heart size is normal. Coronary artery calcifications are noted. Mediastinum/Nodes: --No mediastinal or hilar lymphadenopathy. --No axillary lymphadenopathy. --No supraclavicular lymphadenopathy. --Normal thyroid gland. --The esophagus is unremarkable Lungs/Pleura: There is bronchial wall thickening and mucus plugging at the lung bases bilaterally, right worse than left. There is some consolidation in the medial right lower lobe. There is an airspace opacity in the medial right upper lobe measuring approximately 1.5 cm (axial series 6, image 30). This  is new since 2008. There are severe emphysematous changes bilaterally. There is a 5 mm pulmonary nodule in the left lower lobe (axial series 4, image 83). This is stable since 2019 and is consistent with a benign process. There is a large calcified granuloma involving the posterior right lower lobe. The trachea is unremarkable. There is no pneumothorax. No significant pleural effusion. Musculoskeletal: There is mild height loss of the T3 vertebral body, likely chronic. There is mild height loss of the T9 vertebral body, likely chronic. Review of the MIP images confirms the above findings. CT ABDOMEN PELVIS FINDINGS Hepatobiliary: Multiple hepatic cysts are noted. These are relatively stable from prior study. Normal gallbladder.There is no biliary ductal dilation. Pancreas: Normal contours without ductal dilatation. No peripancreatic fluid collection. Spleen: No splenic laceration or hematoma. Adrenals/Urinary Tract: --Adrenal glands: No adrenal hemorrhage. --Right kidney/ureter: No hydronephrosis or perinephric hematoma. --Left kidney/ureter: There are nonobstructing stones in the lower pole the left kidney. --Urinary bladder: Unremarkable. Stomach/Bowel: --Stomach/Duodenum: Stomach is distended with an air-fluid level. --Small bowel: No dilatation or inflammation. --Colon: There is a large amount of stool in the colon. --Appendix: Normal. Vascular/Lymphatic: Atherosclerotic calcification is present within the non-aneurysmal abdominal aorta, without hemodynamically significant stenosis. Is a probable high-grade stenosis involving the external iliac artery on the left (axial series 2, image 54). --No retroperitoneal lymphadenopathy. --No mesenteric lymphadenopathy. --No pelvic or inguinal lymphadenopathy. Reproductive: Unremarkable Other: No ascites or free air. The abdominal wall is normal. Musculoskeletal. There is an acute displaced fracture of the proximal right femur. There are end-stage degenerative changes of  both hips. IMPRESSION: 1. Acute displaced fracture of the proximal right femur. There are end-stage degenerative changes of both hips. 2. Small amount of consolidation in the medial right lower lobe with associated bronchial wall thickening and mucus plugging is suggestive of infectious or reactive bronchiolitis. 3. There is a 1.5 cm airspace opacity in the medial right upper lobe. This may represent an infiltrate, atelectasis, or pulmonary nodule. A three-month follow-up CT of the chest is recommended for further evaluation. 4. Severe emphysematous changes. 5. Large amount of stool in the colon may indicate constipation. 6. Suspected high-grade stenosis of the left external iliac artery. 7. Nonobstructive left lower pole stones. 8. Dilated main pulmonary artery which can be seen in patients with elevated pulmonary artery pressures. 9. Aortic Atherosclerosis (ICD10-I70.0) and Emphysema (ICD10-J43.9). Electronically Signed   By: Katherine Mantle M.D.   On: 08/16/2019 22:32   DG Hip Unilat W or Wo Pelvis 2-3 Views Right  Result Date: 08/16/2019 CLINICAL DATA:  Unwitnessed fall EXAM: DG HIP (WITH OR WITHOUT PELVIS) 2-3V RIGHT COMPARISON:  09/14/2017 FINDINGS: Acute intertrochanteric fracture of the proximal right femur without significant displacement or angulation. No hip dislocation. Severe degenerative changes of the bilateral hip joints. Bony pelvis appears intact. Sacrum and pelvis are partially obscured by overlying bowel gas and stool. IMPRESSION: Acute intertrochanteric fracture of the proximal right femur without significant displacement or angulation. Electronically Signed   By: Duanne Guess D.O.   On: 08/16/2019 20:27        Scheduled Meds: . Chlorhexidine Gluconate Cloth  6 each Topical Daily  . ipratropium-albuterol  3 mL Nebulization Q6H  . mupirocin ointment  1 application Nasal BID  . tranexamic acid (CYKLOKAPRON) topical - INTRAOP  2,000 mg Topical Once   Continuous Infusions: .  sodium chloride 75 mL/hr at 08/17/19 1453  . azithromycin    . cefTRIAXone (ROCEPHIN)  IV      Assessment & Plan:   Principal Problem:   Closed fracture of femur, intertrochanteric, right, initial encounter [facility]) Active Problems:   Acute on chronic respiratory failure with hypoxia and hypercapnia (HCC)   Right middle lobe pneumonia   COPD with acute exacerbation (HCC)   Essential hypertension   Fall at home, initial encounter   Closed right hip fracture (HCC)   Pulmonary nodule   Closed fracture of femur, intertrochanteric, right, initial encounter (HCC) Accidental fall at home, initial encounter -Plan for right hip trochanteric femoral nail.  -Pain control -anesthesia plans for spinal -cardiology consult for cardiac clearance    Acute on chronic respiratory failure with hypoxia and hypercapnia (HCC)   Right middle lobe pneumonia/community-acquired versus aspiration   COPD with acute exacerbation (HCC) -CTA chest ruled out acute PE -Right middle lobe infiltrate seen -IV Rocephin and azithromycin -Scheduled and as needed duo nebs  -covid negative -Supplemental oxygen to keep sats over 90%  Elevated troponin -Troponin was 36>>69 suspect related to demand ischemia -Patient denies chest pain and has no EKG changes -Continue to cycle to peak -Echocardiogram pending, cards consulted for cardiac clearance  BPH with LUTS and acute urinary retention -Continue tamsulosin --patient noted to have acute urinary retention while awaiting an inpatient bed with several failed attempts by Er provider to place foley. Bladder scan with at time of attempt --urology consult placed     Pulmonary nodule -CT chest in 3 months recommended by radiologist  Rheumatoid arthritis -Continue sulfasalazine   DVT prophylaxis: SCD Code Status:full code Family Communication: none at bedside Disposition Plan: likely  will need snf Barrier: needs to go to OR today       LOS: 1 day    Time spent:45 min with >50% on coc    Lynn Ito, MD [facility] Pager [phone]  If 7PM-7AM, please contact night-coverage [email or web address] Password [facility] 08/17/2019, 3:14 PM

## 2019-08-17 NOTE — Anesthesia Preprocedure Evaluation (Addendum)
Anesthesia Evaluation  Patient identified by MRN, date of birth, ID band Patient awake    Reviewed: Allergy & Precautions, NPO status , Patient's Chart, lab work & pertinent test results  Airway Mallampati: II  TM Distance: >3 FB     Dental  (+) Poor Dentition, Upper Dentures   Pulmonary pneumonia, COPD,  COPD inhaler and oxygen dependent,    Pulmonary exam normal        Cardiovascular hypertension, Normal cardiovascular exam+ dysrhythmias Atrial Fibrillation      Neuro/Psych negative neurological ROS  negative psych ROS   GI/Hepatic negative GI ROS, Neg liver ROS,   Endo/Other  negative endocrine ROS  Renal/GU negative Renal ROS     Musculoskeletal  (+) Arthritis , Rheumatoid disorders,    Abdominal Normal abdominal exam  (+)   Peds  Hematology negative hematology ROS (+)   Anesthesia Other Findings Past Medical History: No date: COPD (chronic obstructive pulmonary disease) (HCC) No date: Rheumatoid arthritis (HCC)  Reproductive/Obstetrics                           Anesthesia Physical Anesthesia Plan  ASA: IV and emergent  Anesthesia Plan: Spinal   Post-op Pain Management:    Induction: Intravenous  PONV Risk Score and Plan:   Airway Management Planned: Nasal Cannula  Additional Equipment:   Intra-op Plan:   Post-operative Plan:   Informed Consent: I have reviewed the patients History and Physical, chart, labs and discussed the procedure including the risks, benefits and alternatives for the proposed anesthesia with the patient or authorized representative who has indicated his/her understanding and acceptance.     Dental advisory given  Plan Discussed with: CRNA and Surgeon  Anesthesia Plan Comments:        Anesthesia Quick Evaluation

## 2019-08-17 NOTE — ED Notes (Signed)
Attempt to insert foley cath without success.

## 2019-08-17 NOTE — OR Nursing (Addendum)
[facility]ham paged at [phone], Dr.Bowers discussed concerns with MD, MD in route to [facility]

## 2019-08-17 NOTE — Op Note (Signed)
DATE OF SURGERY:  08/17/2019  TIME: 9:16 PM  PATIENT NAME:  Thomas Nicholson  AGE: 86 y.o.  PRE-OPERATIVE DIAGNOSIS:  Right Hip Fracture  POST-OPERATIVE DIAGNOSIS:  SAME  PROCEDURE:  RIGHT INTRAMEDULLARY (IM) NAIL INTERTROCHANTRIC  SURGEON:  Lyndle Herrlich  EBL:  1500 cc  COMPLICATIONS:  bleeding  OPERATIVE IMPLANTS: Synthes trochanteric femoral nail  9 mm by 380 mm  with interlocking helical blade 100 mm  PREOPERATIVE INDICATIONS:  Thomas Nicholson is a 86 y.o. year old who fell and suffered a hip fracture. He was brought into the ER and then admitted and optimized and then elected for surgical intervention.    The risks benefits and alternatives were discussed with the patient including but not limited to the risks of nonoperative treatment, versus surgical intervention including infection, bleeding, nerve injury, malunion, nonunion, hardware prominence, hardware failure, need for hardware removal, blood clots, cardiopulmonary complications, morbidity, mortality, among others, and they were willing to proceed.    OPERATIVE PROCEDURE:  The patient was brought to the operating room and placed in the supine position.  Spinal anesthesia was administered, with a foley placed previously by Urology. He was placed on the fracture table.  Closed reduction was performed under C-arm guidance. The length of the femur was also measured using fluoroscopy. Time out was then performed after sterile prep and drape. He received preoperative antibiotics.  Incision was made proximal to the greater trochanter. A guidewire was placed in the appropriate position. Confirmation was made on AP and lateral views. The above-named nail was opened. I opened the proximal femur with a reamer. I then placed the nail by hand easily down. I did not need to ream the femur.  Once the nail was completely seated, I placed a guidepin into the femoral head into the center center position through a second incision.  I  measured the length, and then reamed the lateral cortex and up into the head. I then placed the helical blade. Slight compression was applied. Anatomic fixation achieved. Bone quality was poor.  I then secured the proximal interlock.  I then removed the instruments, and took final C-arm pictures AP and lateral the entire length of the leg. Anatomic reconstruction was achieved.  Bleeding was encountered from the incision for the helical blade. The bleeding appeared to be arterial in nature. The incision was extended proximally. Due to the amount of blood loss, an intraoperative vascular consult was placed. With the assistance of Dr. Myra Gianotti the lateral circumflex vessels and profundii perforating vessels were ligated. The wounds were irrigated copiously and closed with Vicryl  followed by staples and dry sterile dressing. Sponge and needle count were correct.   The patient was awakened and returned to PACU in stable and satisfactory condition. The patient tolerated the procedure well.  During the procedure 4 units of PRBC's, 1 unit of FFP and 2 units of albumin were administered by the anesthesia team.  He will be weightbearing as tolerated.    Lyndle Herrlich

## 2019-08-17 NOTE — Progress Notes (Signed)
PT Cancellation Note  Patient Details Name: Thomas Nicholson MRN: [id number] DOB: [birth year]   Cancelled Treatment:    Reason Eval/Treat Not Completed: Patient not medically ready. Patient with femur fracture, per ortho consult is pending operative procedure later this date. Will discontinue PT orders, please feel free to place new orders/consult as appropriate after patient has surgery.   Alva Garnet PT, DPT, CSCS    08/17/2019, 9:13 AM

## 2019-08-17 NOTE — Progress Notes (Signed)
eLink Physician-Brief Progress Note Patient Name: Thomas Nicholson DOB: August 18, 1932 MRN: [id number]   Date of Service  08/17/2019  HPI/Events of Note  55M with hx of COPD (on 2L home O2) and rheumatoid arthritis who presented after a fall, subsequently found to have a R intertrochanteric hip fracture. He was also more hypoxemic than usual and so has been treated for a possible COPD exacerbation vs pneumonia (VBG: 7.27/84/--/39/BE +8.3).  He has since undergone R intramedullary nailing with orthopedic surgery, which was c/b arterial bleeding (likely of profunda branch per op note) which required surgical ligation.  At this time, patient is sleeping comfortably. Satting 91% on 3L (near home requirement), BP 95/73, RR 15.   eICU Interventions  # Resp / COPD: - Albuterol nebs PRN - If not back to baseline in AM, consider a short (5 day) course of low dose steroids (pred 20-40mg ) for mild COPD exacerbation. Already received IV methylpred on 3/20. - Continue azithro/ceftriaxone (will reduce azithro to 250mg  daily -- standard CAP dose).  # MSK: s/p IM nailing of R femur c/b arterial bleeding requiring ligation. S/p 1u FFP and 3u pRBCs. - Repeat CBC in AM - F/u surgery recs  DVT PPX: On hold at minimum until AM given bleeding complication.  GI PPX: Not indicated.  Code Status: Full.     Intervention Category Evaluation Type: New Patient Evaluation  Emit Kuenzel Kadie Balestrieri 08/17/2019, 11:11 PM

## 2019-08-17 NOTE — Consult Note (Signed)
ORTHOPAEDIC CONSULTATION  REQUESTING PHYSICIAN: Lynn Ito, MD  Chief Complaint: right hip pain  HPI: Thomas Nicholson is a 86 y.o. male who complains of right hip pain after fall. Please see H&P and ED notes for details. Denies any numbness, tingling or constitutional symptoms.  Past Medical History:  Diagnosis Date  . COPD (chronic obstructive pulmonary disease) (HCC)   . Rheumatoid arthritis (HCC)    History reviewed. No pertinent surgical history. Social History   Socioeconomic History  . Marital status: Single    Spouse name: Not on file  . Number of children: Not on file  . Years of education: Not on file  . Highest education level: Not on file  Occupational History  . Not on file  Tobacco Use  . Smoking status: Never Smoker  . Smokeless tobacco: Never Used  Substance and Sexual Activity  . Alcohol use: Not Currently  . Drug use: Not Currently  . Sexual activity: Not on file  Other Topics Concern  . Not on file  Social History Narrative  . Not on file   Social Determinants of Health   Financial Resource Strain:   . Difficulty of Paying Living Expenses:   Food Insecurity:   . Worried About Programme researcher, broadcasting/film/video in the Last Year:   . Barista in the Last Year:   Transportation Needs:   . Freight forwarder (Medical):   Marland Kitchen Lack of Transportation (Non-Medical):   Physical Activity:   . Days of Exercise per Week:   . Minutes of Exercise per Session:   Stress:   . Feeling of Stress :   Social Connections:   . Frequency of Communication with Friends and Family:   . Frequency of Social Gatherings with Friends and Family:   . Attends Religious Services:   . Active Member of Clubs or Organizations:   . Attends Banker Meetings:   Marland Kitchen Marital Status:    History reviewed. No pertinent family history. No Known Allergies Prior to Admission medications   Medication Sig Start Date End Date Taking? Authorizing Provider  albuterol (VENTOLIN  HFA) 108 (90 Base) MCG/ACT inhaler Inhale 2 puffs into the lungs every 6 (six) hours as needed for wheezing or shortness of breath. 07/07/19  Yes [provider]  alendronate (FOSAMAX) 70 MG tablet Take 70 mg by mouth every Friday. 08/11/19  Yes [provider]  cyanocobalamin 1000 MCG tablet Take 1,000 mcg by mouth daily. 03/21/19  Yes [provider]  folic acid (FOLVITE) 1 MG tablet Take 1 mg by mouth daily. 07/28/19  Yes [provider]  HYDROcodone-acetaminophen (NORCO/VICODIN) 5-325 MG tablet Take 1 tablet by mouth 2 (two) times daily as needed for pain. 07/08/19  Yes [provider]  omeprazole (PRILOSEC) 20 MG capsule Take 20 mg by mouth daily. 04/04/19  Yes [provider]  predniSONE (DELTASONE) 5 MG tablet Take 5 mg by mouth daily. 05/28/19  Yes [provider]  sulfaSALAzine (AZULFIDINE) 500 MG tablet Take 1,000 mg by mouth 2 (two) times daily. 04/14/19  Yes [provider]  tamsulosin (FLOMAX) 0.4 MG CAPS capsule Take 0.4 mg by mouth daily. 07/28/19  Yes [provider]  Mon[place] Fantasia INHUB 250-50 MCG/DOSE AEPB Inhale 1 puff into the lungs 2 (two) times daily. 07/04/19  Yes [provider]   DG Chest 1 View  Result Date: 08/16/2019 CLINICAL DATA:  Pt to ED by EMS after unwitnessed fall while walking his dog. Pt was found  outside by EMS and states he was outside for approx 30 mins. EXAM: CHEST  1 VIEW COMPARISON:  Chest radiograph 11/16/2005 FINDINGS: Tortuous thoracic aorta. Normal heart size. Lungs are hyperinflated. Linear opacities at the right lung base likely reflect scarring or atelectasis. Small right pleural effusion or pleural thickening. There is an area of lucency under the central hemidiaphragm of uncertain etiology. IMPRESSION: 1. Lucency under the central hemidiaphragm of uncertain etiology. Recommend abdominal radiographs to exclude free air. 2. Small right pleural effusion or pleural thickening. Minimal  opacities at the right lung base likely reflect atelectasis or scarring. 3. COPD. Electronically Signed   By: Emmaline Kluver M.D.   On: 08/16/2019 20:30   CT Head Wo Contrast  Result Date: 08/16/2019 CLINICAL DATA:  Fall while walking dog, found down after 30 minutes EXAM: CT HEAD WITHOUT CONTRAST CT CERVICAL SPINE WITHOUT CONTRAST TECHNIQUE: Multidetector CT imaging of the head and cervical spine was performed following the standard protocol without intravenous contrast. Multiplanar CT image reconstructions of the cervical spine were also generated. COMPARISON:  Cervical spine MRI January 04, 2006 FINDINGS: CT HEAD FINDINGS Brain: No evidence of acute infarction, hemorrhage, hydrocephalus, extra-axial collection or mass lesion/mass effect. Symmetric prominence of the ventricles, cisterns and sulci compatible with parenchymal volume loss. Patchy areas of white matter hypoattenuation are most compatible with chronic microvascular angiopathy. Vascular: Atherosclerotic calcification of the carotid siphons and intradural vertebral arteries. No hyperdense vessel. Skull: No calvarial fracture or suspicious osseous lesion. No scalp swelling or hematoma. Sinuses/Orbits: Paranasal sinuses and mastoid air cells are predominantly clear. Included orbital structures are unremarkable. Benign senescent scleral plaque. Other: Age-indeterminate deformities of the bilateral nasal bones, favor chronic in the absence of swelling. CT CERVICAL SPINE FINDINGS Alignment: Straightening of normal cervical lordosis. Bony fusion across the C5-6 vertebral bodies. Partial fusion across the C5-6 facets on the left as well. Mild anterolisthesis of C4 on 5 is likely on a degenerative basis reflecting some adjacent segment disease. Appearance is similar to comparison MRI from January 04, 2006. No abnormally widened, jumped or perched facets. Skull base and vertebrae: Fusion of C5-6, as above. The osseous structures appear diffusely demineralized  which may limit detection of small or nondisplaced fractures. No definite acute fracture. No primary bone lesion or focal pathologic process. Multilevel Schmorl's node formations are noted. Soft tissues and spinal canal: No pre or paravertebral fluid or swelling. No visible canal hematoma. Disc levels: Advanced multilevel cervical spondylitic and facet degenerative changes throughout the cervical spine. Subchondral sclerosis and cystic change of the dens at the atlantodental articulation. Mild-to-moderate canal stenoses are noted at C4-5 and C6-7. Additional uncinate spurring and advanced hypertrophic facet degenerative change results in mild-to-moderate multilevel neural foraminal narrowing as well. Upper chest: Extensive severe centrilobular and paraseptal emphysematous changes with some larger apical bulla. Coronal narrowing of the trachea, likely reflective of COPD, with scattered secretions. Other: Normal thyroid. Cervical carotid atherosclerosis. IMPRESSION: 1. No acute intracranial abnormality. 2. Small vessel ischemic disease and parenchymal volume loss. 3. Age-indeterminate deformities of the bilateral nasal bones, favor chronic in the absence of swelling. Correlate for point tenderness. 4. No acute fracture or traumatic listhesis of the cervical spine. 5. Advanced multilevel cervical spondylitic and facet degenerative changes. 6.  Emphysema (ICD10-J43.9) and features of COPD. 7. Cervical and intracranial atherosclerosis. Electronically Signed   By: Kreg Shropshire M.D.   On: 08/16/2019 20:07   CT Angio Chest PE W and/or Wo Contrast  Result Date: 08/16/2019 CLINICAL DATA:  Acute pain due  to trauma EXAM: CT CHEST, ABDOMEN, AND PELVIS WITH CONTRAST TECHNIQUE: Multidetector CT imaging of the chest, abdomen and pelvis was performed following the standard protocol during bolus administration of intravenous contrast. CONTRAST:  42mL OMNIPAQUE IOHEXOL 350 MG/ML SOLN COMPARISON:  Oct 03, 2017. FINDINGS: CT CHEST  FINDINGS Cardiovascular: Contrast injection is sufficient to demonstrate satisfactory opacification of the pulmonary arteries to the segmental level. There is no pulmonary embolus. The main pulmonary artery is dilated measuring up to approximately 3.2 cm. There is no CT evidence of acute right heart strain. There is extensive irregular calcified and noncalcified plaque at the aortic arch. The heart size is normal. Coronary artery calcifications are noted. Mediastinum/Nodes: --No mediastinal or hilar lymphadenopathy. --No axillary lymphadenopathy. --No supraclavicular lymphadenopathy. --Normal thyroid gland. --The esophagus is unremarkable Lungs/Pleura: There is bronchial wall thickening and mucus plugging at the lung bases bilaterally, right worse than left. There is some consolidation in the medial right lower lobe. There is an airspace opacity in the medial right upper lobe measuring approximately 1.5 cm (axial series 6, image 30). This is new since 2008. There are severe emphysematous changes bilaterally. There is a 5 mm pulmonary nodule in the left lower lobe (axial series 4, image 83). This is stable since 2019 and is consistent with a benign process. There is a large calcified granuloma involving the posterior right lower lobe. The trachea is unremarkable. There is no pneumothorax. No significant pleural effusion. Musculoskeletal: There is mild height loss of the T3 vertebral body, likely chronic. There is mild height loss of the T9 vertebral body, likely chronic. Review of the MIP images confirms the above findings. CT ABDOMEN PELVIS FINDINGS Hepatobiliary: Multiple hepatic cysts are noted. These are relatively stable from prior study. Normal gallbladder.There is no biliary ductal dilation. Pancreas: Normal contours without ductal dilatation. No peripancreatic fluid collection. Spleen: No splenic laceration or hematoma. Adrenals/Urinary Tract: --Adrenal glands: No adrenal hemorrhage. --Right kidney/ureter: No  hydronephrosis or perinephric hematoma. --Left kidney/ureter: There are nonobstructing stones in the lower pole the left kidney. --Urinary bladder: Unremarkable. Stomach/Bowel: --Stomach/Duodenum: Stomach is distended with an air-fluid level. --Small bowel: No dilatation or inflammation. --Colon: There is a large amount of stool in the colon. --Appendix: Normal. Vascular/Lymphatic: Atherosclerotic calcification is present within the non-aneurysmal abdominal aorta, without hemodynamically significant stenosis. Is a probable high-grade stenosis involving the external iliac artery on the left (axial series 2, image 54). --No retroperitoneal lymphadenopathy. --No mesenteric lymphadenopathy. --No pelvic or inguinal lymphadenopathy. Reproductive: Unremarkable Other: No ascites or free air. The abdominal wall is normal. Musculoskeletal. There is an acute displaced fracture of the proximal right femur. There are end-stage degenerative changes of both hips. IMPRESSION: 1. Acute displaced fracture of the proximal right femur. There are end-stage degenerative changes of both hips. 2. Small amount of consolidation in the medial right lower lobe with associated bronchial wall thickening and mucus plugging is suggestive of infectious or reactive bronchiolitis. 3. There is a 1.5 cm airspace opacity in the medial right upper lobe. This may represent an infiltrate, atelectasis, or pulmonary nodule. A three-month follow-up CT of the chest is recommended for further evaluation. 4. Severe emphysematous changes. 5. Large amount of stool in the colon may indicate constipation. 6. Suspected high-grade stenosis of the left external iliac artery. 7. Nonobstructive left lower pole stones. 8. Dilated main pulmonary artery which can be seen in patients with elevated pulmonary artery pressures. 9. Aortic Atherosclerosis (ICD10-I70.0) and Emphysema (ICD10-J43.9). Electronically Signed   By: Beryle Quant.D.  On: 08/16/2019 22:32   CT  Cervical Spine Wo Contrast  Result Date: 08/16/2019 CLINICAL DATA:  Fall while walking dog, found down after 30 minutes EXAM: CT HEAD WITHOUT CONTRAST CT CERVICAL SPINE WITHOUT CONTRAST TECHNIQUE: Multidetector CT imaging of the head and cervical spine was performed following the standard protocol without intravenous contrast. Multiplanar CT image reconstructions of the cervical spine were also generated. COMPARISON:  Cervical spine MRI January 04, 2006 FINDINGS: CT HEAD FINDINGS Brain: No evidence of acute infarction, hemorrhage, hydrocephalus, extra-axial collection or mass lesion/mass effect. Symmetric prominence of the ventricles, cisterns and sulci compatible with parenchymal volume loss. Patchy areas of white matter hypoattenuation are most compatible with chronic microvascular angiopathy. Vascular: Atherosclerotic calcification of the carotid siphons and intradural vertebral arteries. No hyperdense vessel. Skull: No calvarial fracture or suspicious osseous lesion. No scalp swelling or hematoma. Sinuses/Orbits: Paranasal sinuses and mastoid air cells are predominantly clear. Included orbital structures are unremarkable. Benign senescent scleral plaque. Other: Age-indeterminate deformities of the bilateral nasal bones, favor chronic in the absence of swelling. CT CERVICAL SPINE FINDINGS Alignment: Straightening of normal cervical lordosis. Bony fusion across the C5-6 vertebral bodies. Partial fusion across the C5-6 facets on the left as well. Mild anterolisthesis of C4 on 5 is likely on a degenerative basis reflecting some adjacent segment disease. Appearance is similar to comparison MRI from January 04, 2006. No abnormally widened, jumped or perched facets. Skull base and vertebrae: Fusion of C5-6, as above. The osseous structures appear diffusely demineralized which may limit detection of small or nondisplaced fractures. No definite acute fracture. No primary bone lesion or focal pathologic process. Multilevel  Schmorl's node formations are noted. Soft tissues and spinal canal: No pre or paravertebral fluid or swelling. No visible canal hematoma. Disc levels: Advanced multilevel cervical spondylitic and facet degenerative changes throughout the cervical spine. Subchondral sclerosis and cystic change of the dens at the atlantodental articulation. Mild-to-moderate canal stenoses are noted at C4-5 and C6-7. Additional uncinate spurring and advanced hypertrophic facet degenerative change results in mild-to-moderate multilevel neural foraminal narrowing as well. Upper chest: Extensive severe centrilobular and paraseptal emphysematous changes with some larger apical bulla. Coronal narrowing of the trachea, likely reflective of COPD, with scattered secretions. Other: Normal thyroid. Cervical carotid atherosclerosis. IMPRESSION: 1. No acute intracranial abnormality. 2. Small vessel ischemic disease and parenchymal volume loss. 3. Age-indeterminate deformities of the bilateral nasal bones, favor chronic in the absence of swelling. Correlate for point tenderness. 4. No acute fracture or traumatic listhesis of the cervical spine. 5. Advanced multilevel cervical spondylitic and facet degenerative changes. 6.  Emphysema (ICD10-J43.9) and features of COPD. 7. Cervical and intracranial atherosclerosis. Electronically Signed   By: Kreg Shropshire M.D.   On: 08/16/2019 20:07   CT ABDOMEN PELVIS W CONTRAST  Result Date: 08/16/2019 CLINICAL DATA:  Acute pain due to trauma EXAM: CT CHEST, ABDOMEN, AND PELVIS WITH CONTRAST TECHNIQUE: Multidetector CT imaging of the chest, abdomen and pelvis was performed following the standard protocol during bolus administration of intravenous contrast. CONTRAST:  42mL OMNIPAQUE IOHEXOL 350 MG/ML SOLN COMPARISON:  Oct 03, 2017. FINDINGS: CT CHEST FINDINGS Cardiovascular: Contrast injection is sufficient to demonstrate satisfactory opacification of the pulmonary arteries to the segmental level. There is no  pulmonary embolus. The main pulmonary artery is dilated measuring up to approximately 3.2 cm. There is no CT evidence of acute right heart strain. There is extensive irregular calcified and noncalcified plaque at the aortic arch. The heart size is normal. Coronary artery calcifications are noted.  Mediastinum/Nodes: --No mediastinal or hilar lymphadenopathy. --No axillary lymphadenopathy. --No supraclavicular lymphadenopathy. --Normal thyroid gland. --The esophagus is unremarkable Lungs/Pleura: There is bronchial wall thickening and mucus plugging at the lung bases bilaterally, right worse than left. There is some consolidation in the medial right lower lobe. There is an airspace opacity in the medial right upper lobe measuring approximately 1.5 cm (axial series 6, image 30). This is new since 2008. There are severe emphysematous changes bilaterally. There is a 5 mm pulmonary nodule in the left lower lobe (axial series 4, image 83). This is stable since 2019 and is consistent with a benign process. There is a large calcified granuloma involving the posterior right lower lobe. The trachea is unremarkable. There is no pneumothorax. No significant pleural effusion. Musculoskeletal: There is mild height loss of the T3 vertebral body, likely chronic. There is mild height loss of the T9 vertebral body, likely chronic. Review of the MIP images confirms the above findings. CT ABDOMEN PELVIS FINDINGS Hepatobiliary: Multiple hepatic cysts are noted. These are relatively stable from prior study. Normal gallbladder.There is no biliary ductal dilation. Pancreas: Normal contours without ductal dilatation. No peripancreatic fluid collection. Spleen: No splenic laceration or hematoma. Adrenals/Urinary Tract: --Adrenal glands: No adrenal hemorrhage. --Right kidney/ureter: No hydronephrosis or perinephric hematoma. --Left kidney/ureter: There are nonobstructing stones in the lower pole the left kidney. --Urinary bladder: Unremarkable.  Stomach/Bowel: --Stomach/Duodenum: Stomach is distended with an air-fluid level. --Small bowel: No dilatation or inflammation. --Colon: There is a large amount of stool in the colon. --Appendix: Normal. Vascular/Lymphatic: Atherosclerotic calcification is present within the non-aneurysmal abdominal aorta, without hemodynamically significant stenosis. Is a probable high-grade stenosis involving the external iliac artery on the left (axial series 2, image 54). --No retroperitoneal lymphadenopathy. --No mesenteric lymphadenopathy. --No pelvic or inguinal lymphadenopathy. Reproductive: Unremarkable Other: No ascites or free air. The abdominal wall is normal. Musculoskeletal. There is an acute displaced fracture of the proximal right femur. There are end-stage degenerative changes of both hips. IMPRESSION: 1. Acute displaced fracture of the proximal right femur. There are end-stage degenerative changes of both hips. 2. Small amount of consolidation in the medial right lower lobe with associated bronchial wall thickening and mucus plugging is suggestive of infectious or reactive bronchiolitis. 3. There is a 1.5 cm airspace opacity in the medial right upper lobe. This may represent an infiltrate, atelectasis, or pulmonary nodule. A three-month follow-up CT of the chest is recommended for further evaluation. 4. Severe emphysematous changes. 5. Large amount of stool in the colon may indicate constipation. 6. Suspected high-grade stenosis of the left external iliac artery. 7. Nonobstructive left lower pole stones. 8. Dilated main pulmonary artery which can be seen in patients with elevated pulmonary artery pressures. 9. Aortic Atherosclerosis (ICD10-I70.0) and Emphysema (ICD10-J43.9). Electronically Signed   By: Katherine Mantle M.D.   On: 08/16/2019 22:32   DG Hip Unilat W or Wo Pelvis 2-3 Views Right  Result Date: 08/16/2019 CLINICAL DATA:  Unwitnessed fall EXAM: DG HIP (WITH OR WITHOUT PELVIS) 2-3V RIGHT COMPARISON:   09/14/2017 FINDINGS: Acute intertrochanteric fracture of the proximal right femur without significant displacement or angulation. No hip dislocation. Severe degenerative changes of the bilateral hip joints. Bony pelvis appears intact. Sacrum and pelvis are partially obscured by overlying bowel gas and stool. IMPRESSION: Acute intertrochanteric fracture of the proximal right femur without significant displacement or angulation. Electronically Signed   By: Duanne Guess D.O.   On: 08/16/2019 20:27    Positive ROS: All other systems have been  reviewed and were otherwise negative with the exception of those mentioned in the HPI and as above.  Physical Exam: General: Alert, no acute distress Cardiovascular: No pedal edema Respiratory: No cyanosis, no use of accessory musculature GI: No organomegaly, abdomen is soft and non-tender Skin: No lesions in the area of chief complaint Neurologic: Sensation intact distally Psychiatric: Patient is competent for consent with normal mood and affect Lymphatic: No axillary or cervical lymphadenopathy  MUSCULOSKELETAL: right leg short, externally rotated. Compartments soft. Good cap refill. Motor and sensory intact distally.  Assessment: Right hip intertrochanteric fracture  Plan: Plan right hip trochanteric femoral nail.   The diagnosis, risks, benefits and alternatives to treatment are all discussed in detail with the patient. Risks include but are not limited to bleeding, infection, deep vein thrombosis, pulmonary embolism, nerve or vascular injury, non-union, repeat operation, persistent pain, weakness, stiffness and death. He understands and is eager to proceed.     Lovell Sheehan, MD    08/17/2019 12:29 PM

## 2019-08-17 NOTE — ED Provider Notes (Signed)
Patient resting at this time.  He is in no acute distress.  EKG performed for noted tachycardia, irregular in nature. ? rhythm  Heart rate 115 QRS 140 QTc 480 Morphology appears similar to previous, no evidence of acute ischemia.  However, some segments and irregularity unclear if multiple PACs or potential coarse A. Fib  No chest pain. Alert. No complaints from patient at this time.    Sharyn Creamer, MD 08/17/19 [phone]

## 2019-08-17 NOTE — Social Work (Signed)
TOC CM/SW consult received for HH/DME  Social work consult in progress. Pt Home O2 - 2L.    Larwance Rote, MSW, LCSW  [phone] 7am-5pm (weekends)

## 2019-08-17 NOTE — Transfer of Care (Signed)
Immediate Anesthesia Transfer of Care Note  Patient: Thomas Nicholson  Procedure(s) Performed: INTRAMEDULLARY (IM) NAIL INTERTROCHANTRIC (Right Hip)  Patient Location: PACU  Anesthesia Type:Spinal  Level of Consciousness: sedated  Airway & Oxygen Therapy: Patient Spontanous Breathing and Patient connected to face mask oxygen  Post-op Assessment: Report given to RN and Post -op Vital signs reviewed and stable  Post vital signs: Reviewed and stable  Last Vitals:  Vitals Value Taken Time  BP 132/90 08/17/19 2131  Temp    Pulse 82 08/17/19 2143  Resp 25 08/17/19 2127  SpO2 98 % 08/17/19 2143  Vitals shown include unvalidated device data.  Last Pain:  Vitals:   08/17/19 2116  TempSrc:   PainSc: 0-No pain         Complications: No apparent anesthesia complications

## 2019-08-17 NOTE — Consult Note (Signed)
Urology Consult  I have been asked to see the patient by Dr. Damita Dunnings, for evaluation and management of urinary retention.  Chief Complaint: Inability to urinate  History of Present Illness: Thomas Nicholson is a 86 y.o. year old being admitted following a fall resulting in right hip fracture.  He is also found to have pneumonia and is hypoxic being medically optimized for surgery.  In the emergency room, he failed to void for multiple hours.  At 1 point, bladder scan showed approximately 400 cc.  Multiple failed attempts at Foley catheter placement including regular catheter and daily x2 both by RN and MD.  Urology called for assistance with catheter placement.  Notably, upon my arrival, patient had voided a small amount, approximately 100 cc but unable to void any additional volume.  She does have a history of BPH.  He saw Dr. Stark Jock in the remote past.  He is currently on Flomax.  He does also report a history of urinary retention.  No prostate surgeries.  Past Medical History:  Diagnosis Date  . COPD (chronic obstructive pulmonary disease) ([place])     History reviewed. No pertinent surgical history.  Home Medications:  Current Meds  Medication Sig  . albuterol (VENTOLIN HFA) 108 (90 Base) MCG/ACT inhaler Inhale 2 puffs into the lungs every 6 (six) hours as needed for wheezing or shortness of breath.  Marland Kitchen alendronate (FOSAMAX) 70 MG tablet Take 70 mg by mouth every Friday.  . cyanocobalamin 1000 MCG tablet Take 1,000 mcg by mouth daily.  . folic acid (FOLVITE) 1 MG tablet Take 1 mg by mouth daily.  Marland Kitchen HYDROcodone-acetaminophen (NORCO/VICODIN) 5-325 MG tablet Take 1 tablet by mouth 2 (two) times daily as needed for pain.  Marland Kitchen omeprazole (PRILOSEC) 20 MG capsule Take 20 mg by mouth daily.  . predniSONE (DELTASONE) 5 MG tablet Take 5 mg by mouth daily.  Marland Kitchen sulfaSALAzine (AZULFIDINE) 500 MG tablet Take 1,000 mg by mouth 2 (two) times daily.  . tamsulosin (FLOMAX) 0.4 MG CAPS capsule  Take 0.4 mg by mouth daily.  Grant Ruts INHUB 250-50 MCG/DOSE AEPB Inhale 1 puff into the lungs 2 (two) times daily.    Allergies: No Known Allergies  No family history on file.  Social History:  reports that he has never smoked. He has never used smokeless tobacco. He reports previous alcohol use. He reports previous drug use.  ROS: A complete review of systems was performed.  Complaining of right knee pain and thirst.  All systems are negative except for pertinent findings as noted.  Physical Exam:  Vital signs in last 24 hours: Temp:  [97.6 F (36.4 C)] 97.6 F (36.4 C) (03/20 1911) Pulse Rate:  [50-121] 50 (03/21 0530) Resp:  [14-23] 14 (03/21 0530) BP: (121-191)/(64-132) 145/70 (03/21 0530) SpO2:  [94 %-100 %] 97 % (03/21 0530) Weight:  [46.3 kg] 46.3 kg (03/20 1912) Constitutional:  Alert and oriented, No acute distress, pleasant, interactive, slightly cachectic appearing HEENT: [place] AT, moist mucus membranes.  Trachea midline, no masses Respiratory: No increased work of breathing, wearing O2 GI: Abdomen is soft, nontender, nondistended, no abdominal masses, nonpalpable bladder GU: Uncircumcised phallus Neurologic: Grossly intact, no focal deficits, moving all 4 extremities Psychiatric: Normal mood and affect   Laboratory Data:  Recent Labs    08/16/19 1931  WBC 14.2*  HGB 10.2*  HCT 34.2*   Recent Labs    08/16/19 1931  NA 140  K 4.4  CL 98  CO2 33*  GLUCOSE 176*  BUN 22  CREATININE 1.25*  CALCIUM 9.4   Recent Labs    08/16/19 1931  INR 1.0    Radiologic Imaging: CT ABDOMEN PELVIS W CONTRAST  Result Date: 08/16/2019 CLINICAL DATA:  Acute pain due to trauma EXAM: CT CHEST, ABDOMEN, AND PELVIS WITH CONTRAST TECHNIQUE: Multidetector CT imaging of the chest, abdomen and pelvis was performed following the standard protocol during bolus administration of intravenous contrast. CONTRAST:  25mL OMNIPAQUE IOHEXOL 350 MG/ML SOLN COMPARISON:  Oct 03, 2017. FINDINGS:  CT CHEST FINDINGS Cardiovascular: Contrast injection is sufficient to demonstrate satisfactory opacification of the pulmonary arteries to the segmental level. There is no pulmonary embolus. The main pulmonary artery is dilated measuring up to approximately 3.2 cm. There is no CT evidence of acute right heart strain. There is extensive irregular calcified and noncalcified plaque at the aortic arch. The heart size is normal. Coronary artery calcifications are noted. Mediastinum/Nodes: --No mediastinal or hilar lymphadenopathy. --No axillary lymphadenopathy. --No supraclavicular lymphadenopathy. --Normal thyroid gland. --The esophagus is unremarkable Lungs/Pleura: There is bronchial wall thickening and mucus plugging at the lung bases bilaterally, right worse than left. There is some consolidation in the medial right lower lobe. There is an airspace opacity in the medial right upper lobe measuring approximately 1.5 cm (axial series 6, image 30). This is new since 2008. There are severe emphysematous changes bilaterally. There is a 5 mm pulmonary nodule in the left lower lobe (axial series 4, image 83). This is stable since 2019 and is consistent with a benign process. There is a large calcified granuloma involving the posterior right lower lobe. The trachea is unremarkable. There is no pneumothorax. No significant pleural effusion. Musculoskeletal: There is mild height loss of the T3 vertebral body, likely chronic. There is mild height loss of the T9 vertebral body, likely chronic. Review of the MIP images confirms the above findings. CT ABDOMEN PELVIS FINDINGS Hepatobiliary: Multiple hepatic cysts are noted. These are relatively stable from prior study. Normal gallbladder.There is no biliary ductal dilation. Pancreas: Normal contours without ductal dilatation. No peripancreatic fluid collection. Spleen: No splenic laceration or hematoma. Adrenals/Urinary Tract: --Adrenal glands: No adrenal hemorrhage. --Right  kidney/ureter: No hydronephrosis or perinephric hematoma. --Left kidney/ureter: There are nonobstructing stones in the lower pole the left kidney. --Urinary bladder: Unremarkable. Stomach/Bowel: --Stomach/Duodenum: Stomach is distended with an air-fluid level. --Small bowel: No dilatation or inflammation. --Colon: There is a large amount of stool in the colon. --Appendix: Normal. Vascular/Lymphatic: Atherosclerotic calcification is present within the non-aneurysmal abdominal aorta, without hemodynamically significant stenosis. Is a probable high-grade stenosis involving the external iliac artery on the left (axial series 2, image 54). --No retroperitoneal lymphadenopathy. --No mesenteric lymphadenopathy. --No pelvic or inguinal lymphadenopathy. Reproductive: Unremarkable Other: No ascites or free air. The abdominal wall is normal. Musculoskeletal. There is an acute displaced fracture of the proximal right femur. There are end-stage degenerative changes of both hips. IMPRESSION: 1. Acute displaced fracture of the proximal right femur. There are end-stage degenerative changes of both hips. 2. Small amount of consolidation in the medial right lower lobe with associated bronchial wall thickening and mucus plugging is suggestive of infectious or reactive bronchiolitis. 3. There is a 1.5 cm airspace opacity in the medial right upper lobe. This may represent an infiltrate, atelectasis, or pulmonary nodule. A three-month follow-up CT of the chest is recommended for further evaluation. 4. Severe emphysematous changes. 5. Large amount of stool in the colon may indicate constipation. 6. Suspected high-grade stenosis of the left  external iliac artery. 7. Nonobstructive left lower pole stones. 8. Dilated main pulmonary artery which can be seen in patients with elevated pulmonary artery pressures. 9. Aortic Atherosclerosis (ICD10-I70.0) and Emphysema (ICD10-J43.9). Electronically Signed   By: Katherine Mantle M.D.   On:  08/16/2019 22:32   DG Hip Unilat W or Wo Pelvis 2-3 Views Right  CT scan personally reviewed.     Procedure: Patient was prepped and draped in the standard sterile fashion with Betadine.  10 cc of 2% lidocaine was instilled per urethra.  Attempted place an 49 coud without resistance at the level of the prostate.  I then attempted a blind wire technique.  Advance a sensor wire which advanced easily presumably to the level of the bladder and the patient in fact reported feeling the wire in his bladder.  I then advanced a 5 [place] open-ended ureteral catheter over the wire presumably to the level of the bladder.  The wire was withdrawn and there was a brisk drip of clear yellow urine confirming its position within the bladder.  The wire was then replaced and the open-ended was removed.  A 16 [place] council tip was advanced over the wire and hubbed at the meatus.  The wire was then withdrawn and there was brisk drainage of yellow urine.  The balloon was filled with 10 cc of sterile water.  The catheter drained approximately 250+ cc of clear yellow urine.  It was secured to his left thigh using the cast of care.   Impression/ Plan: 86 year old with difficult Foley catheter status post wire-guided catheter placement at bedside successfully.  -Would recommend maintaining Foley catheter at minimun until 1 to 2 days after hip surgery given difficulty with placement -Please remove Foley in the early morning hours so that urology is available to assist with replacement if needed during daytime hours -Continue Flomax  08/17/2019, 7:18 AM  Vanna Scotland,  MD

## 2019-08-17 NOTE — ED Notes (Signed)
Final attempt to establish a foley by Dr Manson Passey was unsuccessful. Dr Para March paged and notified of situation. She stated that she would put in a urology consult.

## 2019-08-17 NOTE — OR Nursing (Signed)
[place] paged at 1952, Dr.Bowers discussed concerns with MD, MD in route to [facility].

## 2019-08-17 NOTE — ED Notes (Signed)
Spoke with dr Marylu Lund r/t patient. Pt is disoriented to time and place, per report was oriented.  Has had small drop in hgb but patient has received NS bolus and maintenance fluids.  Pt currently on 2 L [place], decreased from 6 L by this.  sats remain currently in mid 90s on 2 L. Pt is COPD pt.  Pt tachy and irregular, appears to be a sinus with PAC.  HR 100-130.

## 2019-08-17 NOTE — Anesthesia Procedure Notes (Signed)
Spinal  Patient location during procedure: OR Start time: 08/17/2019 6:50 PM End time: 08/17/2019 6:52 PM Staffing Performed: anesthesiologist  Anesthesiologist: Yves Dill, MD Preanesthetic Checklist Completed: patient identified, IV checked, site marked, risks and benefits discussed, surgical consent, monitors and equipment checked, pre-op evaluation and timeout performed Spinal Block Patient position: sitting Prep: Betadine Patient monitoring: heart rate, continuous pulse ox, blood pressure and cardiac monitor Approach: midline Location: L3-4 Injection technique: single-shot Needle Needle type: Quincke  Needle gauge: 25 G Needle length: 9 cm Additional Notes Time out called.  Patient sedated and placed in sitting position.  Back prepped and draped in sterile fashion.  A skin wheal was made in the R paramedian position of the L3-L4 interspace with 1% Lidocaine plain.  A 25G Quincke needle was advanced with the return of clear, colorless CSF in all 4 quadrants.  Patient tolerated the procedure well.  2.8 cc of preservative free 0.5% Marcaine was used.

## 2019-08-18 DIAGNOSIS — E43 Unspecified severe protein-calorie malnutrition: Secondary | ICD-10-CM | POA: Insufficient documentation

## 2019-08-18 LAB — BASIC METABOLIC PANEL
Anion gap: 5 (ref 5–15)
BUN: 17 mg/dL (ref 8–23)
CO2: 31 mmol/L (ref 22–32)
Calcium: 8.1 mg/dL — ABNORMAL LOW (ref 8.9–10.3)
Chloride: 109 mmol/L (ref 98–111)
Creatinine, Ser: 0.81 mg/dL (ref 0.61–1.24)
GFR calc Af Amer: >60 mL/min (ref >60)
GFR calc non Af Amer: >60 mL/min (ref >60)
Glucose, Bld: 171 mg/dL — ABNORMAL HIGH (ref 70–99)
Potassium: 4.1 mmol/L (ref 3.5–5.1)
Sodium: 145 mmol/L (ref 135–145)

## 2019-08-18 LAB — CBC
HCT: 35.1 % — ABNORMAL LOW (ref 39.0–52.0)
Hemoglobin: 11.5 g/dL — ABNORMAL LOW (ref 13.0–17.0)
MCH: 31.5 pg (ref 26.0–34.0)
MCHC: 32.8 g/dL (ref 30.0–36.0)
MCV: 96.2 fL (ref 80.0–100.0)
Platelets: 96 10*3/uL — ABNORMAL LOW (ref 150–400)
RBC: 3.65 MIL/uL — ABNORMAL LOW (ref 4.22–5.81)
RDW: 16.7 % — ABNORMAL HIGH (ref 11.5–15.5)
WBC: 12.8 10*3/uL — ABNORMAL HIGH (ref 4.0–10.5)
nRBC: 0 % (ref 0.0–0.2)

## 2019-08-18 LAB — GLUCOSE, CAPILLARY: Glucose-Capillary: 125 mg/dL — ABNORMAL HIGH (ref 70–99)

## 2019-08-18 LAB — PREPARE RBC (CROSSMATCH)

## 2019-08-18 MED ORDER — ORAL CARE MOUTH RINSE
15.0000 mL | Freq: Two times a day (BID) | OROMUCOSAL | Status: DC
  Administered 2019-08-18 – 2019-08-23 (×10): 15 mL via OROMUCOSAL

## 2019-08-18 MED ORDER — ENSURE ENLIVE PO LIQD
237.0000 mL | Freq: Three times a day (TID) | ORAL | Status: DC
  Administered 2019-08-18 – 2019-08-24 (×14): 237 mL via ORAL

## 2019-08-18 MED ORDER — ADULT MULTIVITAMIN W/MINERALS CH
1.0000 | ORAL_TABLET | Freq: Every day | ORAL | Status: DC
  Administered 2019-08-19 – 2019-08-24 (×5): 1 via ORAL
  Filled 2019-08-18 (×6): qty 1

## 2019-08-18 NOTE — Evaluation (Addendum)
Physical Therapy Evaluation Patient Details Name: Thomas Nicholson MRN: [id number] DOB: Oct 21, 1932 Today's Date: 08/18/2019   History of Present Illness  Jaeven Wanzer is an 86yoM who [facility] after a fall onto his Right side c subsequent Rt hip pain. Larey Seat trying to open a door while walking the dog, hit head upon falling without LOC. Pt also noted to have PNA. Pt now s/p ORIF of hip, WBAT per Dr. Odis Luster. PMH: COPD on home O2 at 2 L and rheumatoid arthritis.  Clinical Impression  Pt admitted with above diagnosis. Pt currently with functional limitations due to the deficits listed below (see "PT Problem List"). Upon entry, pt in bed, awake and agreeable to participate. Pt is HOH which makes cognitive assessment difficulty, but pt appears to have ST memory deficits and confusion at times in session. The pt is alert and oriented x4, pleasant, conversational, and generally a good historian. Pt has divided attention, not attending to exercises, even takes a phone call to converse with a friend, confused in thinking that session was all finished despite being told otherwise. Pt on 2L/min O2 at baseline, but this date requires 4L for sats >89%. Pt has back pain that limited his tolerance to less than semirecumbent HOB and sturggles to move RLE without assistance due to pain. Functional mobility assessment demonstrates increased effort/time requirements, poor tolerance, and need for physical assistance, whereas the patient performed these at a higher level of independence PTA. Pt lives alone, has limited support at home, formerly AMB without device, now much more limited- will need extended rehab prior to return to home. Pt will benefit from skilled PT intervention to increase independence and safety with basic mobility in preparation for discharge to the venue listed below.       Follow Up Recommendations SNF;Supervision for mobility/OOB;Supervision - Intermittent    Equipment  Recommendations  Rolling walker with 5" wheels    Recommendations for Other Services       Precautions / Restrictions Precautions Precautions: Fall Restrictions RLE Weight Bearing: Weight bearing as tolerated      Mobility  Bed Mobility               General bed mobility comments: Deferred as patient desires to take a phone call mid session. Pt struggling with moving limbs in bed; acut eback pain precludes lying flat in bed.  Transfers                    Ambulation/Gait                Stairs            Wheelchair Mobility    Modified Rankin (Stroke Patients Only)       Balance                                             Pertinent Vitals/Pain Pain Assessment: 0-10 Pain Score: 6  Pain Location: low back pain Pain Descriptors / Indicators: Aching Pain Intervention(s): Limited activity within patient's tolerance;Monitored during session;Premedicated before session;Repositioned;Patient requesting pain meds-RN notified    Home Living Family/patient expects to be discharged to:: Private residence Living Arrangements: Alone   Type of Home: House Home Access: Stairs to enter Entrance Stairs-Rails: Right(housewall on other side) Entrance Stairs-Number of Steps: 3 Home Layout: One level Home Equipment: Walker - 2 wheels;Wheelchair -  manual;Cane - single point      Prior Function Level of Independence: Independent with assistive device(s)         Comments: Pt still drives, a friend brings groceries; Uses O2 at home. But no AD use. Has to hold onl to furniture/walls for stability.     Hand Dominance        Extremity/Trunk Assessment   Upper Extremity Assessment Upper Extremity Assessment: Generalized weakness    Lower Extremity Assessment Lower Extremity Assessment: Generalized weakness    Cervical / Trunk Assessment Cervical / Trunk Assessment: Kyphotic  Communication   Communication: No difficulties   Cognition Arousal/Alertness: Awake/alert   Overall Cognitive Status: No family/caregiver present to determine baseline cognitive functioning Area of Impairment: Attention;Following commands;Memory;Problem solving                   Current Attention Level: Divided Memory: Decreased short-term memory Following Commands: Follows one step commands consistently     Problem Solving: Slow processing;Requires verbal cues;Decreased initiation        General Comments      Exercises General Exercises - Lower Extremity Ankle Circles/Pumps: AROM;10 reps;Both;Limitations Ankle Circles/Pumps Limitations: distracted, does not keep count, stops before target goal of reps. Heel Slides: AAROM;Both;10 reps;Limitations Heel Slides Limitations: distracted, does not keep count, stops before target goal of reps. limited success following cues to attend to task Hip ABduction/ADduction: AAROM;Both;Limitations;10 reps Hip Abduction/Adduction Limitations: distracted, does not keep count, stops before target goal of reps. limited success following cues to attend to task   Assessment/Plan    PT Assessment Patient needs continued PT services  PT Problem List Decreased strength;Decreased range of motion;Decreased activity tolerance;Decreased mobility;Decreased knowledge of use of DME;Decreased safety awareness       PT Treatment Interventions DME instruction;Gait training;Stair training;Functional mobility training;Therapeutic activities;Therapeutic exercise;Patient/family education;Cognitive remediation;Balance training    PT Goals (Current goals can be found in the Care Plan section)  Acute Rehab PT Goals Patient Stated Goal: return to home with little dog PT Goal Formulation: With patient Time For Goal Achievement: 09/01/19 Potential to Achieve Goals: Fair    Frequency BID   Barriers to discharge Decreased caregiver support      Co-evaluation               AM-PAC PT "6 Clicks"  Mobility  Outcome Measure Help needed turning from your back to your side while in a flat bed without using bedrails?: Total Help needed moving from lying on your back to sitting on the side of a flat bed without using bedrails?: Total Help needed moving to and from a bed to a chair (including a wheelchair)?: Total Help needed standing up from a chair using your arms (e.g., wheelchair or bedside chair)?: Total Help needed to walk in hospital room?: Total Help needed climbing 3-5 steps with a railing? : Total 6 Click Score: 6    End of Session Equipment Utilized During Treatment: Oxygen Activity Tolerance: Patient tolerated treatment well;Patient limited by pain;Other (comment)(pt confused, not attending to session) Patient left: in bed;with call bell/phone within reach;with SCD's reapplied Nurse Communication: Mobility status PT Visit Diagnosis: Difficulty in walking, not elsewhere classified (R26.2);Muscle weakness (generalized) (M62.81);History of falling (Z91.81)    Time: 5400-8676 PT Time Calculation (min) (ACUTE ONLY): 26 min   Charges:   PT Evaluation $PT Eval Moderate Complexity: 1 Mod PT Treatments $Therapeutic Exercise: 8-22 mins        12:36 PM, 08/18/19 Etta Grandchild, PT, DPT Physical Therapist - [facility]  [phone] (ASCOM)   Scarleth Brame C 08/18/2019, 12:32 PM

## 2019-08-18 NOTE — Progress Notes (Signed)
Initial Nutrition Assessment  DOCUMENTATION CODES:   Severe malnutrition in context of chronic illness, Underweight  INTERVENTION:  Provide Ensure Enlive po TID, each supplement provides 350 kcal and 20 grams of protein.  Provide daily MVI.  Monitor magnesium, potassium, and phosphorus daily for at least 3 days, MD to replete as needed, as pt is at risk for refeeding syndrome given severe malnutrition.  NUTRITION DIAGNOSIS:   Severe Malnutrition related to chronic illness(COPD) as evidenced by severe fat depletion, severe muscle depletion.  GOAL:   Patient will meet greater than or equal to 90% of their needs  MONITOR:   PO intake, Supplement acceptance, Labs, Weight trends, I & O's  REASON FOR ASSESSMENT:   Malnutrition Screening Tool, Consult COPD Protocol  ASSESSMENT:   86 year old male with PMHx of COPD, rheumatoid arthritis admitted after a fall with right hip fracture s/p right IM nail 3/21, PNA, acute exacerbation of COPD.   Attempted to meet with patient at bedside but he was asleep and would not awake to name call or during NFPE. According to chart patient ate 100% of a meal at 0400 this morning but unsure what meal that might have been as that is before the kitchen is open. Patient would benefit from oral nutrition supplements to help meet calorie/protein needs.  No weight history in chart to trend. Patient currently documented to be 46.3 kg (102 lbs).  Medications reviewed and include: Colace 100 mg BID, NS at 75 mL/hr, azithromycin, ceftriaxone.  Labs reviewed.  Discussed on rounds.  NUTRITION - FOCUSED PHYSICAL EXAM:    Most Recent Value  Orbital Region  Severe depletion  Upper Arm Region  Severe depletion  Thoracic and Lumbar Region  Severe depletion  Buccal Region  Severe depletion  Temple Region  Severe depletion  Clavicle Bone Region  Severe depletion  Clavicle and Acromion Bone Region  Severe depletion  Scapular Bone Region  Unable to assess   Dorsal Hand  Severe depletion  Patellar Region  Severe depletion  Anterior Thigh Region  Severe depletion  Posterior Calf Region  Severe depletion  Edema (RD Assessment)  Unable to assess  Hair  Reviewed  Eyes  Unable to assess  Mouth  Unable to assess  Skin  Reviewed  Nails  Reviewed     Diet Order:   Diet Order            Diet regular Room service appropriate? Yes; Fluid consistency: Thin  Diet effective now             EDUCATION NEEDS:   No education needs have been identified at this time  Skin:  Skin Assessment: Skin Integrity Issues:(closed incision right hip)  Last BM:  08/16/2019 per chart  Height:   Ht Readings from Last 1 Encounters:  08/16/19 5\' 7"  (1.702 m)   Weight:   Wt Readings from Last 1 Encounters:  08/16/19 46.3 kg   Ideal Body Weight:  67.3 kg  BMI:  Body mass index is 15.98 kg/m.  Estimated Nutritional Needs:   Kcal:  1400-1600  Protein:  70-80 grams  Fluid:  1.4-1.6 L/day  08/18/19, MS, RD, LDN Pager number available on Amion

## 2019-08-18 NOTE — Progress Notes (Signed)
Physical Therapy Treatment Patient Details Name: Thomas Nicholson MRN: [id number] DOB: May 05, 1933 Today's Date: 08/18/2019    History of Present Illness Bruno Leach is an 86yoM who comes to [facility] on 3/20 after a fall onto his Right side c subsequent Rt hip pain. Larey Seat trying to open a door while walking the dog, hit head upon falling without LOC. Pt also noted to have PNA. Pt now s/p ORIF of hip, WBAT per Dr. Odis Luster. PMH: COPD on home O2 at 2 L and rheumatoid arthritis.    PT Comments    Pt in bed upon entry, agreeable to OOB attempt this session. Pt remains on elevated O2 flow rate from baseline, 4L upon entry, but requiring up to 5L for sitting at EOB (89% SpO2) and then 6L for recovery after transfer. Pt requires mod-maxA to EOB, TotalA for back to supine, MaxA to rise to standing with RW. HR is elevated at EOB, consistently upper teens to 120bpm, then after STS rises to 125bpm. Pt motivated ot put forth great effort, but session largely limited by HR and O2. Will continues to follow.    Follow Up Recommendations  SNF;Supervision for mobility/OOB;Supervision - Intermittent     Equipment Recommendations  Rolling walker with 5" wheels    Recommendations for Other Services       Precautions / Restrictions Precautions Precautions: Fall Restrictions Weight Bearing Restrictions: Yes RLE Weight Bearing: Weight bearing as tolerated    Mobility  Bed Mobility Overal bed mobility: Needs Assistance Bed Mobility: Supine to Sit;Sit to Supine     Supine to sit: Max assist Sit to supine: Total assist;+2 for physical assistance   General bed mobility comments: Tries to independently manage his RLE with other leg, trunk strength is fairly good. Has increased SOB, requires addiitonal O2 at EOB, moved to 5-6L, 89-94%. HR 117-120 at EOB.  Transfers Overall transfer level: Needs assistance Equipment used: Rolling walker (2 wheeled) Transfers: Sit to/from Stand Sit to Stand: Max assist          General transfer comment: Giving a strong effort, but very limited in the legs, a very slow rise to standing. HR increases to 125 bpm after STS and taking a few tiny steps to center of bed.  Ambulation/Gait                 Stairs             Wheelchair Mobility    Modified Rankin (Stroke Patients Only)       Balance Overall balance assessment: Needs assistance Sitting-balance support: Single extremity supported;Feet supported Sitting balance-Leahy Scale: Fair     Standing balance support: Bilateral upper extremity supported;During functional activity Standing balance-Leahy Scale: Poor Standing balance comment: struggles to remain upright adn to progress RLE                            Cognition Arousal/Alertness: Awake/alert Behavior During Therapy: WFL for tasks assessed/performed Overall Cognitive Status: Within Functional Limits for tasks assessed Area of Impairment: Attention;Following commands;Memory;Problem solving                   Current Attention Level: Divided Memory: Decreased short-term memory Following Commands: Follows one step commands consistently     Problem Solving: Slow processing;Requires verbal cues;Decreased initiation        Exercises General Exercises - Lower Extremity Ankle Circles/Pumps: AROM;10 reps;Both;Limitations Ankle Circles/Pumps Limitations: distracted, does not keep count, stops before target goal  of reps. Heel Slides: AAROM;Both;10 reps;Limitations Heel Slides Limitations: distracted, does not keep count, stops before target goal of reps. limited success following cues to attend to task Hip ABduction/ADduction: AAROM;Both;Limitations;10 reps Hip Abduction/Adduction Limitations: distracted, does not keep count, stops before target goal of reps. limited success following cues to attend to task    General Comments        Pertinent Vitals/Pain Pain Assessment: 0-10 Pain Score: 6  Pain  Location: acute low back pain Pain Descriptors / Indicators: Aching Pain Intervention(s): Limited activity within patient's tolerance;Monitored during session;Premedicated before session    Home Living                      Prior Function            PT Goals (current goals can now be found in the care plan section) Acute Rehab PT Goals Patient Stated Goal: return to home with little dog PT Goal Formulation: With patient Time For Goal Achievement: 09/01/19 Potential to Achieve Goals: Fair Progress towards PT goals: Progressing toward goals    Frequency    BID      PT Plan Current plan remains appropriate    Co-evaluation              AM-PAC PT "6 Clicks" Mobility   Outcome Measure  Help needed turning from your back to your side while in a flat bed without using bedrails?: Total Help needed moving from lying on your back to sitting on the side of a flat bed without using bedrails?: Total Help needed moving to and from a bed to a chair (including a wheelchair)?: Total Help needed standing up from a chair using your arms (e.g., wheelchair or bedside chair)?: Total Help needed to walk in hospital room?: A Lot Help needed climbing 3-5 steps with a railing? : Total 6 Click Score: 7    End of Session Equipment Utilized During Treatment: Oxygen Activity Tolerance: Patient tolerated treatment well;Patient limited by pain;Other (comment) Patient left: in bed;with call bell/phone within reach;with SCD's reapplied Nurse Communication: Mobility status PT Visit Diagnosis: Difficulty in walking, not elsewhere classified (R26.2);Muscle weakness (generalized) (M62.81);History of falling (Z91.81)     Time: 1441-1510 PT Time Calculation (min) (ACUTE ONLY): 29 min  Charges:  $Therapeutic Exercise: 23-37 mins                     4:20 PM, 08/18/19 Etta Grandchild, PT, DPT Physical Therapist - [facility]  [phone] ([place])    Shyann Hefner C 08/18/2019, 4:17 PM

## 2019-08-18 NOTE — Progress Notes (Addendum)
Acute Blood Loss anemia Subjective:  Patient reports pain as mild.    Objective:   VITALS:   Vitals:   08/18/19 1100 08/18/19 1130 08/18/19 1200 08/18/19 1230  BP: (!) 118/54 119/63 (!) 128/53 133/67  Pulse: 81 82 87 88  Resp: 16 15 15 17   Temp:      TempSrc:      SpO2: 100% 100% 100% 100%  Weight:      Height:        PHYSICAL EXAM:  ABD soft Sensation intact distally Dorsiflexion/Plantar flexion intact Incision: dressing C/D/I Compartment soft  LABS  Results for orders placed or performed during the hospital encounter of 08/16/19 (from the past 24 hour(s))  Surgical PCR screen     Status: None   Collection Time: 08/17/19  1:31 PM   Specimen: Nasal Mucosa; Nasal Swab  Result Value Ref Range   MRSA, PCR NEGATIVE NEGATIVE   Staphylococcus aureus NEGATIVE NEGATIVE  Prepare RBC (crossmatch)     Status: None   Collection Time: 08/17/19  7:40 PM  Result Value Ref Range   Order Confirmation      ORDER PROCESSED BY BLOOD BANK Performed at [facility], [address] [place], [place]   Prepare RBC (crossmatch)     Status: None   Collection Time: 08/17/19  8:01 PM  Result Value Ref Range   Order Confirmation      DUPLICATE REQUEST Performed at [facility], [address] [place], [place]   Prepare fresh frozen plasma     Status: None (Preliminary result)   Collection Time: 08/17/19  9:32 PM  Result Value Ref Range   Unit Number 08/19/19    Blood Component Type THW PLS APHR    Unit division A0    Status of Unit ISSUED    Transfusion Status OK TO TRANSFUSE   Basic metabolic panel     Status: Abnormal   Collection Time: 08/18/19  3:46 AM  Result Value Ref Range   Sodium 145 135 - 145 mmol/L   Potassium 4.1 3.5 - 5.1 mmol/L   Chloride 109 98 - 111 mmol/L   CO2 31 22 - 32 mmol/L   Glucose, Bld 171 (H) 70 - 99 mg/dL   BUN 17 8 - 23 mg/dL   Creatinine, Ser 08/20/19 0.61 - 1.24 mg/dL   Calcium 8.1 (L) 8.9 - 10.3 mg/dL    GFR calc non Af Amer >60 >60 mL/min   GFR calc Af Amer >60 >60 mL/min   Anion gap 5 5 - 15  CBC     Status: Abnormal   Collection Time: 08/18/19  3:46 AM  Result Value Ref Range   WBC 12.8 (H) 4.0 - 10.5 K/uL   RBC 3.65 (L) 4.22 - 5.81 MIL/uL   Hemoglobin 11.5 (L) 13.0 - 17.0 g/dL   HCT 08/20/19 (L) 81.8 - 56.3 %   MCV 96.2 80.0 - 100.0 fL   MCH 31.5 26.0 - 34.0 pg   MCHC 32.8 30.0 - 36.0 g/dL   RDW 14.9 (H) 70.2 - 63.7 %   Platelets 96 (L) 150 - 400 K/uL   nRBC 0.0 0.0 - 0.2 %    DG Chest 1 View  Result Date: 08/16/2019 CLINICAL DATA:  Pt to ED by EMS after unwitnessed fall while walking his dog. Pt was found outside by EMS and states he was outside for approx 30 mins. EXAM: CHEST  1 VIEW COMPARISON:  Chest radiograph 11/16/2005 FINDINGS: Tortuous  thoracic aorta. Normal heart size. Lungs are hyperinflated. Linear opacities at the right lung base likely reflect scarring or atelectasis. Small right pleural effusion or pleural thickening. There is an area of lucency under the central hemidiaphragm of uncertain etiology. IMPRESSION: 1. Lucency under the central hemidiaphragm of uncertain etiology. Recommend abdominal radiographs to exclude free air. 2. Small right pleural effusion or pleural thickening. Minimal opacities at the right lung base likely reflect atelectasis or scarring. 3. COPD. Electronically Signed   By: Emmaline Kluver M.D.   On: 08/16/2019 20:30   CT Head Wo Contrast  Result Date: 08/16/2019 CLINICAL DATA:  Fall while walking dog, found down after 30 minutes EXAM: CT HEAD WITHOUT CONTRAST CT CERVICAL SPINE WITHOUT CONTRAST TECHNIQUE: Multidetector CT imaging of the head and cervical spine was performed following the standard protocol without intravenous contrast. Multiplanar CT image reconstructions of the cervical spine were also generated. COMPARISON:  Cervical spine MRI January 04, 2006 FINDINGS: CT HEAD FINDINGS Brain: No evidence of acute infarction, hemorrhage, hydrocephalus,  extra-axial collection or mass lesion/mass effect. Symmetric prominence of the ventricles, cisterns and sulci compatible with parenchymal volume loss. Patchy areas of white matter hypoattenuation are most compatible with chronic microvascular angiopathy. Vascular: Atherosclerotic calcification of the carotid siphons and intradural vertebral arteries. No hyperdense vessel. Skull: No calvarial fracture or suspicious osseous lesion. No scalp swelling or hematoma. Sinuses/Orbits: Paranasal sinuses and mastoid air cells are predominantly clear. Included orbital structures are unremarkable. Benign senescent scleral plaque. Other: Age-indeterminate deformities of the bilateral nasal bones, favor chronic in the absence of swelling. CT CERVICAL SPINE FINDINGS Alignment: Straightening of normal cervical lordosis. Bony fusion across the C5-6 vertebral bodies. Partial fusion across the C5-6 facets on the left as well. Mild anterolisthesis of C4 on 5 is likely on a degenerative basis reflecting some adjacent segment disease. Appearance is similar to comparison MRI from January 04, 2006. No abnormally widened, jumped or perched facets. Skull base and vertebrae: Fusion of C5-6, as above. The osseous structures appear diffusely demineralized which may limit detection of small or nondisplaced fractures. No definite acute fracture. No primary bone lesion or focal pathologic process. Multilevel Schmorl's node formations are noted. Soft tissues and spinal canal: No pre or paravertebral fluid or swelling. No visible canal hematoma. Disc levels: Advanced multilevel cervical spondylitic and facet degenerative changes throughout the cervical spine. Subchondral sclerosis and cystic change of the dens at the atlantodental articulation. Mild-to-moderate canal stenoses are noted at C4-5 and C6-7. Additional uncinate spurring and advanced hypertrophic facet degenerative change results in mild-to-moderate multilevel neural foraminal narrowing as  well. Upper chest: Extensive severe centrilobular and paraseptal emphysematous changes with some larger apical bulla. Coronal narrowing of the trachea, likely reflective of COPD, with scattered secretions. Other: Normal thyroid. Cervical carotid atherosclerosis. IMPRESSION: 1. No acute intracranial abnormality. 2. Small vessel ischemic disease and parenchymal volume loss. 3. Age-indeterminate deformities of the bilateral nasal bones, favor chronic in the absence of swelling. Correlate for point tenderness. 4. No acute fracture or traumatic listhesis of the cervical spine. 5. Advanced multilevel cervical spondylitic and facet degenerative changes. 6.  Emphysema (ICD10-J43.9) and features of COPD. 7. Cervical and intracranial atherosclerosis. Electronically Signed   By: Kreg Shropshire M.D.   On: 08/16/2019 20:07   CT Angio Chest PE W and/or Wo Contrast  Result Date: 08/16/2019 CLINICAL DATA:  Acute pain due to trauma EXAM: CT CHEST, ABDOMEN, AND PELVIS WITH CONTRAST TECHNIQUE: Multidetector CT imaging of the chest, abdomen and pelvis was performed following the  standard protocol during bolus administration of intravenous contrast. CONTRAST:  22mL OMNIPAQUE IOHEXOL 350 MG/ML SOLN COMPARISON:  Oct 03, 2017. FINDINGS: CT CHEST FINDINGS Cardiovascular: Contrast injection is sufficient to demonstrate satisfactory opacification of the pulmonary arteries to the segmental level. There is no pulmonary embolus. The main pulmonary artery is dilated measuring up to approximately 3.2 cm. There is no CT evidence of acute right heart strain. There is extensive irregular calcified and noncalcified plaque at the aortic arch. The heart size is normal. Coronary artery calcifications are noted. Mediastinum/Nodes: --No mediastinal or hilar lymphadenopathy. --No axillary lymphadenopathy. --No supraclavicular lymphadenopathy. --Normal thyroid gland. --The esophagus is unremarkable Lungs/Pleura: There is bronchial wall thickening and mucus  plugging at the lung bases bilaterally, right worse than left. There is some consolidation in the medial right lower lobe. There is an airspace opacity in the medial right upper lobe measuring approximately 1.5 cm (axial series 6, image 30). This is new since 2008. There are severe emphysematous changes bilaterally. There is a 5 mm pulmonary nodule in the left lower lobe (axial series 4, image 83). This is stable since 2019 and is consistent with a benign process. There is a large calcified granuloma involving the posterior right lower lobe. The trachea is unremarkable. There is no pneumothorax. No significant pleural effusion. Musculoskeletal: There is mild height loss of the T3 vertebral body, likely chronic. There is mild height loss of the T9 vertebral body, likely chronic. Review of the MIP images confirms the above findings. CT ABDOMEN PELVIS FINDINGS Hepatobiliary: Multiple hepatic cysts are noted. These are relatively stable from prior study. Normal gallbladder.There is no biliary ductal dilation. Pancreas: Normal contours without ductal dilatation. No peripancreatic fluid collection. Spleen: No splenic laceration or hematoma. Adrenals/Urinary Tract: --Adrenal glands: No adrenal hemorrhage. --Right kidney/ureter: No hydronephrosis or perinephric hematoma. --Left kidney/ureter: There are nonobstructing stones in the lower pole the left kidney. --Urinary bladder: Unremarkable. Stomach/Bowel: --Stomach/Duodenum: Stomach is distended with an air-fluid level. --Small bowel: No dilatation or inflammation. --Colon: There is a large amount of stool in the colon. --Appendix: Normal. Vascular/Lymphatic: Atherosclerotic calcification is present within the non-aneurysmal abdominal aorta, without hemodynamically significant stenosis. Is a probable high-grade stenosis involving the external iliac artery on the left (axial series 2, image 54). --No retroperitoneal lymphadenopathy. --No mesenteric lymphadenopathy. --No  pelvic or inguinal lymphadenopathy. Reproductive: Unremarkable Other: No ascites or free air. The abdominal wall is normal. Musculoskeletal. There is an acute displaced fracture of the proximal right femur. There are end-stage degenerative changes of both hips. IMPRESSION: 1. Acute displaced fracture of the proximal right femur. There are end-stage degenerative changes of both hips. 2. Small amount of consolidation in the medial right lower lobe with associated bronchial wall thickening and mucus plugging is suggestive of infectious or reactive bronchiolitis. 3. There is a 1.5 cm airspace opacity in the medial right upper lobe. This may represent an infiltrate, atelectasis, or pulmonary nodule. A three-month follow-up CT of the chest is recommended for further evaluation. 4. Severe emphysematous changes. 5. Large amount of stool in the colon may indicate constipation. 6. Suspected high-grade stenosis of the left external iliac artery. 7. Nonobstructive left lower pole stones. 8. Dilated main pulmonary artery which can be seen in patients with elevated pulmonary artery pressures. 9. Aortic Atherosclerosis (ICD10-I70.0) and Emphysema (ICD10-J43.9). Electronically Signed   By: Constance Holster M.D.   On: 08/16/2019 22:32   CT Cervical Spine Wo Contrast  Result Date: 08/16/2019 CLINICAL DATA:  Fall while walking dog, found down after  30 minutes EXAM: CT HEAD WITHOUT CONTRAST CT CERVICAL SPINE WITHOUT CONTRAST TECHNIQUE: Multidetector CT imaging of the head and cervical spine was performed following the standard protocol without intravenous contrast. Multiplanar CT image reconstructions of the cervical spine were also generated. COMPARISON:  Cervical spine MRI January 04, 2006 FINDINGS: CT HEAD FINDINGS Brain: No evidence of acute infarction, hemorrhage, hydrocephalus, extra-axial collection or mass lesion/mass effect. Symmetric prominence of the ventricles, cisterns and sulci compatible with parenchymal volume loss.  Patchy areas of white matter hypoattenuation are most compatible with chronic microvascular angiopathy. Vascular: Atherosclerotic calcification of the carotid siphons and intradural vertebral arteries. No hyperdense vessel. Skull: No calvarial fracture or suspicious osseous lesion. No scalp swelling or hematoma. Sinuses/Orbits: Paranasal sinuses and mastoid air cells are predominantly clear. Included orbital structures are unremarkable. Benign senescent scleral plaque. Other: Age-indeterminate deformities of the bilateral nasal bones, favor chronic in the absence of swelling. CT CERVICAL SPINE FINDINGS Alignment: Straightening of normal cervical lordosis. Bony fusion across the C5-6 vertebral bodies. Partial fusion across the C5-6 facets on the left as well. Mild anterolisthesis of C4 on 5 is likely on a degenerative basis reflecting some adjacent segment disease. Appearance is similar to comparison MRI from January 04, 2006. No abnormally widened, jumped or perched facets. Skull base and vertebrae: Fusion of C5-6, as above. The osseous structures appear diffusely demineralized which may limit detection of small or nondisplaced fractures. No definite acute fracture. No primary bone lesion or focal pathologic process. Multilevel Schmorl's node formations are noted. Soft tissues and spinal canal: No pre or paravertebral fluid or swelling. No visible canal hematoma. Disc levels: Advanced multilevel cervical spondylitic and facet degenerative changes throughout the cervical spine. Subchondral sclerosis and cystic change of the dens at the atlantodental articulation. Mild-to-moderate canal stenoses are noted at C4-5 and C6-7. Additional uncinate spurring and advanced hypertrophic facet degenerative change results in mild-to-moderate multilevel neural foraminal narrowing as well. Upper chest: Extensive severe centrilobular and paraseptal emphysematous changes with some larger apical bulla. Coronal narrowing of the trachea,  likely reflective of COPD, with scattered secretions. Other: Normal thyroid. Cervical carotid atherosclerosis. IMPRESSION: 1. No acute intracranial abnormality. 2. Small vessel ischemic disease and parenchymal volume loss. 3. Age-indeterminate deformities of the bilateral nasal bones, favor chronic in the absence of swelling. Correlate for point tenderness. 4. No acute fracture or traumatic listhesis of the cervical spine. 5. Advanced multilevel cervical spondylitic and facet degenerative changes. 6.  Emphysema (ICD10-J43.9) and features of COPD. 7. Cervical and intracranial atherosclerosis. Electronically Signed   By: Kreg Shropshire M.D.   On: 08/16/2019 20:07   CT ABDOMEN PELVIS W CONTRAST  Result Date: 08/16/2019 CLINICAL DATA:  Acute pain due to trauma EXAM: CT CHEST, ABDOMEN, AND PELVIS WITH CONTRAST TECHNIQUE: Multidetector CT imaging of the chest, abdomen and pelvis was performed following the standard protocol during bolus administration of intravenous contrast. CONTRAST:  50mL OMNIPAQUE IOHEXOL 350 MG/ML SOLN COMPARISON:  Oct 03, 2017. FINDINGS: CT CHEST FINDINGS Cardiovascular: Contrast injection is sufficient to demonstrate satisfactory opacification of the pulmonary arteries to the segmental level. There is no pulmonary embolus. The main pulmonary artery is dilated measuring up to approximately 3.2 cm. There is no CT evidence of acute right heart strain. There is extensive irregular calcified and noncalcified plaque at the aortic arch. The heart size is normal. Coronary artery calcifications are noted. Mediastinum/Nodes: --No mediastinal or hilar lymphadenopathy. --No axillary lymphadenopathy. --No supraclavicular lymphadenopathy. --Normal thyroid gland. --The esophagus is unremarkable Lungs/Pleura: There is bronchial wall thickening  and mucus plugging at the lung bases bilaterally, right worse than left. There is some consolidation in the medial right lower lobe. There is an airspace opacity in the  medial right upper lobe measuring approximately 1.5 cm (axial series 6, image 30). This is new since 2008. There are severe emphysematous changes bilaterally. There is a 5 mm pulmonary nodule in the left lower lobe (axial series 4, image 83). This is stable since 2019 and is consistent with a benign process. There is a large calcified granuloma involving the posterior right lower lobe. The trachea is unremarkable. There is no pneumothorax. No significant pleural effusion. Musculoskeletal: There is mild height loss of the T3 vertebral body, likely chronic. There is mild height loss of the T9 vertebral body, likely chronic. Review of the MIP images confirms the above findings. CT ABDOMEN PELVIS FINDINGS Hepatobiliary: Multiple hepatic cysts are noted. These are relatively stable from prior study. Normal gallbladder.There is no biliary ductal dilation. Pancreas: Normal contours without ductal dilatation. No peripancreatic fluid collection. Spleen: No splenic laceration or hematoma. Adrenals/Urinary Tract: --Adrenal glands: No adrenal hemorrhage. --Right kidney/ureter: No hydronephrosis or perinephric hematoma. --Left kidney/ureter: There are nonobstructing stones in the lower pole the left kidney. --Urinary bladder: Unremarkable. Stomach/Bowel: --Stomach/Duodenum: Stomach is distended with an air-fluid level. --Small bowel: No dilatation or inflammation. --Colon: There is a large amount of stool in the colon. --Appendix: Normal. Vascular/Lymphatic: Atherosclerotic calcification is present within the non-aneurysmal abdominal aorta, without hemodynamically significant stenosis. Is a probable high-grade stenosis involving the external iliac artery on the left (axial series 2, image 54). --No retroperitoneal lymphadenopathy. --No mesenteric lymphadenopathy. --No pelvic or inguinal lymphadenopathy. Reproductive: Unremarkable Other: No ascites or free air. The abdominal wall is normal. Musculoskeletal. There is an acute  displaced fracture of the proximal right femur. There are end-stage degenerative changes of both hips. IMPRESSION: 1. Acute displaced fracture of the proximal right femur. There are end-stage degenerative changes of both hips. 2. Small amount of consolidation in the medial right lower lobe with associated bronchial wall thickening and mucus plugging is suggestive of infectious or reactive bronchiolitis. 3. There is a 1.5 cm airspace opacity in the medial right upper lobe. This may represent an infiltrate, atelectasis, or pulmonary nodule. A three-month follow-up CT of the chest is recommended for further evaluation. 4. Severe emphysematous changes. 5. Large amount of stool in the colon may indicate constipation. 6. Suspected high-grade stenosis of the left external iliac artery. 7. Nonobstructive left lower pole stones. 8. Dilated main pulmonary artery which can be seen in patients with elevated pulmonary artery pressures. 9. Aortic Atherosclerosis (ICD10-I70.0) and Emphysema (ICD10-J43.9). Electronically Signed   By: Katherine Mantle M.D.   On: 08/16/2019 22:32   ECHOCARDIOGRAM COMPLETE  Result Date: 08/17/2019    ECHOCARDIOGRAM REPORT   Patient Name:   Thomas Nicholson Date of Exam: 08/17/2019 Medical Rec #:  [id number]         Height:       67.0 in Accession #:    [id number]        Weight:       102.0 lb Date of Birth:  December 28, 1932         BSA:          1.519 m Patient Age:    86 years          BP:           120/80 mmHg Patient Gender: M  HR:           70 bpm. Exam Location:  [facility] Procedure: 2D Echo, Cardiac Doppler and Color Doppler Indications:    R07.9* Chest pain, unspecified; R06.02 SOB  History:        Patient has no prior history of Echocardiogram examinations.  Sonographer:    Wonda Cerise Referring Phys: [id number] Andris Baumann IMPRESSIONS  1. Left ventricular ejection fraction, by estimation, is 50 to 55%. The left ventricle has low normal function. The left ventricle has no  regional wall motion abnormalities. Left ventricular diastolic parameters are consistent with Grade I diastolic dysfunction (impaired relaxation).  2. Right ventricular systolic function is normal. The right ventricular size is normal. There is moderately elevated pulmonary artery systolic pressure.  3. Borderline to mild aortic valve stenosis, estimated AVA 1.5 -1.6 cm sq, mean gradient 11 mm Hg  4. Frequent ectopy noted FINDINGS  Left Ventricle: Left ventricular ejection fraction, by estimation, is 50 to 55%. The left ventricle has low normal function. The left ventricle has no regional wall motion abnormalities. The left ventricular internal cavity size was normal in size. There is no left ventricular hypertrophy. Left ventricular diastolic parameters are consistent with Grade I diastolic dysfunction (impaired relaxation). Right Ventricle: The right ventricular size is normal. No increase in right ventricular wall thickness. Right ventricular systolic function is normal. There is moderately elevated pulmonary artery systolic pressure. The tricuspid regurgitant velocity is 3.18 m/s, and with an assumed right atrial pressure of 5 mmHg, the estimated right ventricular systolic pressure is 45.4 mmHg. Left Atrium: Left atrial size was normal in size. Right Atrium: Right atrial size was normal in size. Pericardium: There is no evidence of pericardial effusion. Mitral Valve: The mitral valve is normal in structure. Normal mobility of the mitral valve leaflets. No evidence of mitral valve regurgitation. No evidence of mitral valve stenosis. Tricuspid Valve: The tricuspid valve is normal in structure. Tricuspid valve regurgitation is mild . No evidence of tricuspid stenosis. Aortic Valve: The aortic valve is normal in structure. Aortic valve regurgitation is not visualized. Mild aortic stenosis is present. Aortic valve mean gradient measures 10.5 mmHg. Aortic valve peak gradient measures 17.9 mmHg. Aortic valve area, by  VTI measures 1.62 cm. Pulmonic Valve: The pulmonic valve was normal in structure. Pulmonic valve regurgitation is not visualized. No evidence of pulmonic stenosis. Aorta: The aortic root is normal in size and structure. Venous: The inferior vena cava is normal in size with greater than 50% respiratory variability, suggesting right atrial pressure of 3 mmHg. IAS/Shunts: No atrial level shunt detected by color flow Doppler.  LEFT VENTRICLE PLAX 2D LVIDd:         4.44 cm LVIDs:         3.09 cm LV PW:         1.27 cm LV IVS:        1.04 cm LVOT diam:     2.20 cm LV SV:         59 LV SV Index:   39 LVOT Area:     3.80 cm  LEFT ATRIUM           Index       RIGHT ATRIUM           Index LA diam:      3.30 cm 2.17 cm/m  RA Area:     15.10 cm LA Vol (A4C): 15.5 ml 10.20 ml/m RA Volume:   40.20 ml  26.47 ml/m  AORTIC VALVE AV Area (Vmax):    1.39 cm AV Area (Vmean):   1.39 cm AV Area (VTI):     1.62 cm AV Vmax:           211.50 cm/s AV Vmean:          153.500 cm/s AV VTI:            0.366 m AV Peak Grad:      17.9 mmHg AV Mean Grad:      10.5 mmHg LVOT Vmax:         77.30 cm/s LVOT Vmean:        56.200 cm/s LVOT VTI:          0.156 m LVOT/AV VTI ratio: 0.43  AORTA Ao Root diam: 3.50 cm TRICUSPID VALVE TR Peak grad:   40.4 mmHg TR Vmax:        318.00 cm/s  SHUNTS Systemic VTI:  0.16 m Systemic Diam: 2.20 cm Julien Nordmann MD Electronically signed by Julien Nordmann MD Signature Date/Time: 08/17/2019/3:40:07 PM    Final    DG HIP OPERATIVE UNILAT W OR W/O PELVIS RIGHT  Result Date: 08/17/2019 CLINICAL DATA:  Intramedullary nail placement EXAM: OPERATIVE RIGHT HIP (WITH PELVIS IF PERFORMED) 3 VIEWS TECHNIQUE: Fluoroscopic spot image(s) were submitted for interpretation post-operatively. COMPARISON:  August 16, 2019 FINDINGS: The patient has undergone intramedullary nail placement for the proximal right femur fracture. The alignment is improved. The hardware is intact. There are expected postsurgical changes including  subcutaneous gas and overlying soft tissue edema. IMPRESSION: Status post intramedullary nail placement for proximal right femur fracture. Electronically Signed   By: Katherine Mantle M.D.   On: 08/17/2019 19:37   DG Hip Unilat W or Wo Pelvis 2-3 Views Right  Result Date: 08/16/2019 CLINICAL DATA:  Unwitnessed fall EXAM: DG HIP (WITH OR WITHOUT PELVIS) 2-3V RIGHT COMPARISON:  09/14/2017 FINDINGS: Acute intertrochanteric fracture of the proximal right femur without significant displacement or angulation. No hip dislocation. Severe degenerative changes of the bilateral hip joints. Bony pelvis appears intact. Sacrum and pelvis are partially obscured by overlying bowel gas and stool. IMPRESSION: Acute intertrochanteric fracture of the proximal right femur without significant displacement or angulation. Electronically Signed   By: Duanne Guess D.O.   On: 08/16/2019 20:27    Assessment/Plan: 1 Day Post-Op   Principal Problem:   Closed fracture of femur, intertrochanteric, right, initial encounter [facility]) Active Problems:   Acute on chronic respiratory failure with hypoxia and hypercapnia (HCC)   Right middle lobe pneumonia   COPD with acute exacerbation (HCC)   Essential hypertension   Fall at home, initial encounter   Closed right hip fracture [facility])   Pulmonary nodule   Up with therapy Discharge to SNF ok from ortho standpoint RTC 12 to 14 days for staple removal   Lyndle Herrlich , MD 08/18/2019, 12:43 PM

## 2019-08-18 NOTE — Anesthesia Postprocedure Evaluation (Signed)
Anesthesia Post Note  Patient: Thomas Nicholson  Procedure(s) Performed: INTRAMEDULLARY (IM) NAIL INTERTROCHANTRIC (Right Hip)  Patient location during evaluation: ICU Anesthesia Type: Spinal Level of consciousness: oriented and awake and alert Pain management: pain level controlled Vital Signs Assessment: post-procedure vital signs reviewed and stable Respiratory status: spontaneous breathing, respiratory function stable and patient connected to nasal cannula oxygen Cardiovascular status: blood pressure returned to baseline and stable Postop Assessment: no headache, no backache and no apparent nausea or vomiting Anesthetic complications: no     Last Vitals:  Vitals:   08/18/19 0600 08/18/19 0630  BP: 127/63 130/69  Pulse: 81 86  Resp: 16 16  Temp:    SpO2: 100% 99%    Last Pain:  Vitals:   08/18/19 0400  TempSrc: Oral  PainSc:                  Rica Mast

## 2019-08-18 NOTE — Progress Notes (Signed)
CSW acknowledges consult for HH/DME needs. CSW will follow-up on needs pending PT/OT evaluations.  Alfonso Ramus, [place] [phone]

## 2019-08-18 NOTE — Progress Notes (Signed)
Pt in no acute distress. Per Dr. Marylu Lund do not remove foley yet, urology placed this for retention. Orders placed to transfer pt to floor with cardiac monitoring. Surgical site intact. No bleeding or hematoma noted. Cap refill less than 3 seconds to RLE and pedal pulses detected by doppler. Will continue to monitor.

## 2019-08-18 NOTE — Progress Notes (Signed)
PROGRESS NOTE    Thomas Nicholson  ZOX:[id number] DOB: [birth year] DOA: 08/16/2019 PCP: Patient, No Pcp Per    Brief Narrative:  Thomas Nicholson is a 86 y.o. male with medical history significant for COPD on home O2 at 2 L and rheumatoid arthritis brought in following fall onto his right side while walking his dog.  He hit his head on the door but did not lose consciousness.  States he was trying to open the door but this spring to the door was broken and he ended up falling. On arrival in the emergency room he was noted to be tachycardic at 110 as well as tachypneic.  He was also hypoxic, requiring up to 5 L to keep sats in the low 90s.  His blood work was for the most part unremarkable.  Hip x-ray showed right intertrochanteric fracture.  Because of tachycardia and tachypnea he had a CTA chest that was negative for PE but showed right middle lobe pneumonia as well as a right upper lobe pulmonary nodule and several other nonacute findings.  The emergency room provider spoke with orthopedist Dr. Odis Luster.  He was started on IV antibiotics for pneumonia and duo nebs covid negative.   Consultants:   Ortho , cardiology  Procedures:   Antimicrobials:   Ceftriaxone Rocephin   Subjective: Overnight issues reviewed.  Patient apparently had bleeding during right IM nail placement, had bleeding of the profunda branch artery status post ligation by vascular surgery and received 4 units packed red blood cells.  He was transferred to stepdown unit overnight for evaluation.  Today he reports feeling fine.  Denies shortness of breath, chest pain or abdominal pain.  Blood pressure has remained in the 110s and stable.  Objective: Vitals:   08/18/19 1230 08/18/19 1400 08/18/19 1430 08/18/19 1510  BP: 133/67 135/60 132/65   Pulse: 88 96 98 (!) 125  Resp: 17 20 18    Temp:  97.9 F (36.6 C)    TempSrc:  Axillary    SpO2: 100% 99% 94% (!) 89%  Weight:      Height:        Intake/Output Summary  (Last 24 hours) at 08/18/2019 1704 Last data filed at 08/18/2019 1435 Gross per 24 hour  Intake 6783.87 ml  Output 1985 ml  Net 4798.87 ml   Filed Weights   08/16/19 1912  Weight: 46.3 kg    Examination:  General exam: Appears calm and comfortable, NAD respiratory system: Clear to auscultation. Respiratory effort normal. Cardiovascular system: S1 & S2 heard, RRR. No JVD, murmurs, rubs, gallops or clicks.  Gastrointestinal system: Abdomen is nondistended, soft and nontender. Normal bowel sounds heard.  No rebound Central nervous system: Alert and oriented.  Grossly intact Extremities: no edema Skin: Warm dry Psychiatry: . Mood & affect appropriate current setting.     Data Reviewed: I have personally reviewed following labs and imaging studies  CBC: Recent Labs  Lab 08/16/19 1931 08/17/19 0742 08/18/19 0346  WBC 14.2* 11.4* 12.8*  NEUTROABS 11.9*  --   --   HGB 10.2* 9.2* 11.5*  HCT 34.2* 30.8* 35.1*  MCV 109.3* 109.6* 96.2  PLT 219 154 96*   Basic Metabolic Panel: Recent Labs  Lab 08/16/19 1931 08/18/19 0346  NA 140 145  K 4.4 4.1  CL 98 109  CO2 33* 31  GLUCOSE 176* 171*  BUN 22 17  CREATININE 1.25* 0.81  CALCIUM 9.4 8.1*   GFR: Estimated Creatinine Clearance: 44.5 mL/min (by C-G formula based  on SCr of 0.81 mg/dL). Liver Function Tests: Recent Labs  Lab 08/16/19 1931  AST 19  ALT 14  ALKPHOS 83  BILITOT 0.6  PROT 6.4*  ALBUMIN 3.1*   No results for input(s): LIPASE, AMYLASE in the last 168 hours. No results for input(s): AMMONIA in the last 168 hours. Coagulation Profile: Recent Labs  Lab 08/16/19 1931  INR 1.0   Cardiac Enzymes: No results for input(s): CKTOTAL, CKMB, CKMBINDEX, TROPONINI in the last 168 hours. BNP (last 3 results) No results for input(s): PROBNP in the last 8760 hours. HbA1C: No results for input(s): HGBA1C in the last 72 hours. CBG: Recent Labs  Lab 08/17/19 0801  GLUCAP 105*   Lipid Profile: No results for  input(s): CHOL, HDL, LDLCALC, TRIG, CHOLHDL, LDLDIRECT in the last 72 hours. Thyroid Function Tests: No results for input(s): TSH, T4TOTAL, FREET4, T3FREE, THYROIDAB in the last 72 hours. Anemia Panel: No results for input(s): VITAMINB12, FOLATE, FERRITIN, TIBC, IRON, RETICCTPCT in the last 72 hours. Sepsis Labs: No results for input(s): PROCALCITON, LATICACIDVEN in the last 168 hours.  Recent Results (from the past 240 hour(s))  Respiratory Panel by RT PCR (Flu A&B, Covid) - Nasopharyngeal Swab     Status: None   Collection Time: 08/16/19 10:22 PM   Specimen: Nasopharyngeal Swab  Result Value Ref Range Status   SARS Coronavirus 2 by RT PCR NEGATIVE NEGATIVE Final    Comment: (NOTE) SARS-CoV-2 target nucleic acids are NOT DETECTED. The SARS-CoV-2 RNA is generally detectable in upper respiratoy specimens during the acute phase of infection. The lowest concentration of SARS-CoV-2 viral copies this assay can detect is 131 copies/mL. A negative result does not preclude SARS-Cov-2 infection and should not be used as the sole basis for treatment or other patient management decisions. A negative result may occur with  improper specimen collection/handling, submission of specimen other than nasopharyngeal swab, presence of viral mutation(s) within the areas targeted by this assay, and inadequate number of viral copies (<131 copies/mL). A negative result must be combined with clinical observations, patient history, and epidemiological information. The expected result is Negative. Fact Sheet for Patients:  https://www.moore.com/ Fact Sheet for Healthcare Providers:  https://www.young.biz/ This test is not yet ap proved or cleared by the [place] FDA and  has been authorized for detection and/or diagnosis of SARS-CoV-2 by FDA under an Emergency Use Authorization (EUA). This EUA will remain  in effect (meaning this test can be used) for the duration  of the COVID-19 declaration under Section 564(b)(1) of the Act, 21 U.S.C. section 360bbb-3(b)(1), unless the authorization is terminated or revoked sooner.    Influenza A by PCR NEGATIVE NEGATIVE Final   Influenza B by PCR NEGATIVE NEGATIVE Final    Comment: (NOTE) The Xpert Xpress SARS-CoV-2/FLU/RSV assay is intended as an aid in  the diagnosis of influenza from Nasopharyngeal swab specimens and  should not be used as a sole basis for treatment. Nasal washings and  aspirates are unacceptable for Xpert Xpress SARS-CoV-2/FLU/RSV  testing. Fact Sheet for Patients: https://www.moore.com/ Fact Sheet for Healthcare Providers: https://www.young.biz/ This test is not yet approved or cleared by the [place] FDA and  has been authorized for detection and/or diagnosis of SARS-CoV-2 by  FDA under an Emergency Use Authorization (EUA). This EUA will remain  in effect (meaning this test can be used) for the duration of the  Covid-19 declaration under Section 564(b)(1) of the Act, 21  U.S.C. section 360bbb-3(b)(1), unless the authorization is  terminated or revoked. Performed  at [facility], [address] [place], [place] [address]   Blood culture (routine x 2)     Status: None (Preliminary result)   Collection Time: 08/17/19 12:03 AM   Specimen: BLOOD  Result Value Ref Range Status   Specimen Description BLOOD BLOOD LEFT FOREARM  Final   Special Requests   Final    BOTTLES DRAWN AEROBIC AND ANAEROBIC Blood Culture adequate volume   Culture   Final    NO GROWTH 1 DAY Performed at [facility], [address], [place], [place] [address]    Report Status PENDING  Incomplete  Blood culture (routine x 2)     Status: None (Preliminary result)   Collection Time: 08/17/19 12:03 AM   Specimen: BLOOD  Result Value Ref Range Status   Specimen Description BLOOD LEFT ANTECUBITAL  Final   Special Requests   Final    BOTTLES DRAWN  AEROBIC AND ANAEROBIC Blood Culture adequate volume   Culture   Final    NO GROWTH 1 DAY Performed at [facility], [address] [place], [place] [address]    Report Status PENDING  Incomplete  Surgical PCR screen     Status: None   Collection Time: 08/17/19  1:31 PM   Specimen: Nasal Mucosa; Nasal Swab  Result Value Ref Range Status   MRSA, PCR NEGATIVE NEGATIVE Final   Staphylococcus aureus NEGATIVE NEGATIVE Final    Comment: (NOTE) The Xpert SA Assay (FDA approved for NASAL specimens in patients 79 years of age and older), is one component of a comprehensive surveillance program. It is not intended to diagnose infection nor to guide or monitor treatment. Performed at [facility], [address], [place], [place] [address]          Radiology Studies: DG Chest 1 View  Result Date: 08/16/2019 CLINICAL DATA:  Pt to ED by EMS after unwitnessed fall while walking his dog. Pt was found outside by EMS and states he was outside for approx 30 mins. EXAM: CHEST  1 VIEW COMPARISON:  Chest radiograph 11/16/2005 FINDINGS: Tortuous thoracic aorta. Normal heart size. Lungs are hyperinflated. Linear opacities at the right lung base likely reflect scarring or atelectasis. Small right pleural effusion or pleural thickening. There is an area of lucency under the central hemidiaphragm of uncertain etiology. IMPRESSION: 1. Lucency under the central hemidiaphragm of uncertain etiology. Recommend abdominal radiographs to exclude free air. 2. Small right pleural effusion or pleural thickening. Minimal opacities at the right lung base likely reflect atelectasis or scarring. 3. COPD. Electronically Signed   By: Audie Pinto M.D.   On: 08/16/2019 20:30   CT Head Wo Contrast  Result Date: 08/16/2019 CLINICAL DATA:  Fall while walking dog, found down after 30 minutes EXAM: CT HEAD WITHOUT CONTRAST CT CERVICAL SPINE WITHOUT CONTRAST TECHNIQUE: Multidetector CT imaging of the head  and cervical spine was performed following the standard protocol without intravenous contrast. Multiplanar CT image reconstructions of the cervical spine were also generated. COMPARISON:  Cervical spine MRI January 04, 2006 FINDINGS: CT HEAD FINDINGS Brain: No evidence of acute infarction, hemorrhage, hydrocephalus, extra-axial collection or mass lesion/mass effect. Symmetric prominence of the ventricles, cisterns and sulci compatible with parenchymal volume loss. Patchy areas of white matter hypoattenuation are most compatible with chronic microvascular angiopathy. Vascular: Atherosclerotic calcification of the carotid siphons and intradural vertebral arteries. No hyperdense vessel. Skull: No calvarial fracture or suspicious osseous lesion. No scalp swelling or hematoma. Sinuses/Orbits: Paranasal sinuses and mastoid air  cells are predominantly clear. Included orbital structures are unremarkable. Benign senescent scleral plaque. Other: Age-indeterminate deformities of the bilateral nasal bones, favor chronic in the absence of swelling. CT CERVICAL SPINE FINDINGS Alignment: Straightening of normal cervical lordosis. Bony fusion across the C5-6 vertebral bodies. Partial fusion across the C5-6 facets on the left as well. Mild anterolisthesis of C4 on 5 is likely on a degenerative basis reflecting some adjacent segment disease. Appearance is similar to comparison MRI from January 04, 2006. No abnormally widened, jumped or perched facets. Skull base and vertebrae: Fusion of C5-6, as above. The osseous structures appear diffusely demineralized which may limit detection of small or nondisplaced fractures. No definite acute fracture. No primary bone lesion or focal pathologic process. Multilevel Schmorl's node formations are noted. Soft tissues and spinal canal: No pre or paravertebral fluid or swelling. No visible canal hematoma. Disc levels: Advanced multilevel cervical spondylitic and facet degenerative changes throughout  the cervical spine. Subchondral sclerosis and cystic change of the dens at the atlantodental articulation. Mild-to-moderate canal stenoses are noted at C4-5 and C6-7. Additional uncinate spurring and advanced hypertrophic facet degenerative change results in mild-to-moderate multilevel neural foraminal narrowing as well. Upper chest: Extensive severe centrilobular and paraseptal emphysematous changes with some larger apical bulla. Coronal narrowing of the trachea, likely reflective of COPD, with scattered secretions. Other: Normal thyroid. Cervical carotid atherosclerosis. IMPRESSION: 1. No acute intracranial abnormality. 2. Small vessel ischemic disease and parenchymal volume loss. 3. Age-indeterminate deformities of the bilateral nasal bones, favor chronic in the absence of swelling. Correlate for point tenderness. 4. No acute fracture or traumatic listhesis of the cervical spine. 5. Advanced multilevel cervical spondylitic and facet degenerative changes. 6.  Emphysema (ICD10-J43.9) and features of COPD. 7. Cervical and intracranial atherosclerosis. Electronically Signed   By: Kreg Shropshire M.D.   On: 08/16/2019 20:07   CT Angio Chest PE W and/or Wo Contrast  Result Date: 08/16/2019 CLINICAL DATA:  Acute pain due to trauma EXAM: CT CHEST, ABDOMEN, AND PELVIS WITH CONTRAST TECHNIQUE: Multidetector CT imaging of the chest, abdomen and pelvis was performed following the standard protocol during bolus administration of intravenous contrast. CONTRAST:  31mL OMNIPAQUE IOHEXOL 350 MG/ML SOLN COMPARISON:  Oct 03, 2017. FINDINGS: CT CHEST FINDINGS Cardiovascular: Contrast injection is sufficient to demonstrate satisfactory opacification of the pulmonary arteries to the segmental level. There is no pulmonary embolus. The main pulmonary artery is dilated measuring up to approximately 3.2 cm. There is no CT evidence of acute right heart strain. There is extensive irregular calcified and noncalcified plaque at the aortic  arch. The heart size is normal. Coronary artery calcifications are noted. Mediastinum/Nodes: --No mediastinal or hilar lymphadenopathy. --No axillary lymphadenopathy. --No supraclavicular lymphadenopathy. --Normal thyroid gland. --The esophagus is unremarkable Lungs/Pleura: There is bronchial wall thickening and mucus plugging at the lung bases bilaterally, right worse than left. There is some consolidation in the medial right lower lobe. There is an airspace opacity in the medial right upper lobe measuring approximately 1.5 cm (axial series 6, image 30). This is new since 2008. There are severe emphysematous changes bilaterally. There is a 5 mm pulmonary nodule in the left lower lobe (axial series 4, image 83). This is stable since 2019 and is consistent with a benign process. There is a large calcified granuloma involving the posterior right lower lobe. The trachea is unremarkable. There is no pneumothorax. No significant pleural effusion. Musculoskeletal: There is mild height loss of the T3 vertebral body, likely chronic. There is mild height loss  of the T9 vertebral body, likely chronic. Review of the MIP images confirms the above findings. CT ABDOMEN PELVIS FINDINGS Hepatobiliary: Multiple hepatic cysts are noted. These are relatively stable from prior study. Normal gallbladder.There is no biliary ductal dilation. Pancreas: Normal contours without ductal dilatation. No peripancreatic fluid collection. Spleen: No splenic laceration or hematoma. Adrenals/Urinary Tract: --Adrenal glands: No adrenal hemorrhage. --Right kidney/ureter: No hydronephrosis or perinephric hematoma. --Left kidney/ureter: There are nonobstructing stones in the lower pole the left kidney. --Urinary bladder: Unremarkable. Stomach/Bowel: --Stomach/Duodenum: Stomach is distended with an air-fluid level. --Small bowel: No dilatation or inflammation. --Colon: There is a large amount of stool in the colon. --Appendix: Normal. Vascular/Lymphatic:  Atherosclerotic calcification is present within the non-aneurysmal abdominal aorta, without hemodynamically significant stenosis. Is a probable high-grade stenosis involving the external iliac artery on the left (axial series 2, image 54). --No retroperitoneal lymphadenopathy. --No mesenteric lymphadenopathy. --No pelvic or inguinal lymphadenopathy. Reproductive: Unremarkable Other: No ascites or free air. The abdominal wall is normal. Musculoskeletal. There is an acute displaced fracture of the proximal right femur. There are end-stage degenerative changes of both hips. IMPRESSION: 1. Acute displaced fracture of the proximal right femur. There are end-stage degenerative changes of both hips. 2. Small amount of consolidation in the medial right lower lobe with associated bronchial wall thickening and mucus plugging is suggestive of infectious or reactive bronchiolitis. 3. There is a 1.5 cm airspace opacity in the medial right upper lobe. This may represent an infiltrate, atelectasis, or pulmonary nodule. A three-month follow-up CT of the chest is recommended for further evaluation. 4. Severe emphysematous changes. 5. Large amount of stool in the colon may indicate constipation. 6. Suspected high-grade stenosis of the left external iliac artery. 7. Nonobstructive left lower pole stones. 8. Dilated main pulmonary artery which can be seen in patients with elevated pulmonary artery pressures. 9. Aortic Atherosclerosis (ICD10-I70.0) and Emphysema (ICD10-J43.9). Electronically Signed   By: Katherine Mantle M.D.   On: 08/16/2019 22:32   CT Cervical Spine Wo Contrast  Result Date: 08/16/2019 CLINICAL DATA:  Fall while walking dog, found down after 30 minutes EXAM: CT HEAD WITHOUT CONTRAST CT CERVICAL SPINE WITHOUT CONTRAST TECHNIQUE: Multidetector CT imaging of the head and cervical spine was performed following the standard protocol without intravenous contrast. Multiplanar CT image reconstructions of the cervical  spine were also generated. COMPARISON:  Cervical spine MRI January 04, 2006 FINDINGS: CT HEAD FINDINGS Brain: No evidence of acute infarction, hemorrhage, hydrocephalus, extra-axial collection or mass lesion/mass effect. Symmetric prominence of the ventricles, cisterns and sulci compatible with parenchymal volume loss. Patchy areas of white matter hypoattenuation are most compatible with chronic microvascular angiopathy. Vascular: Atherosclerotic calcification of the carotid siphons and intradural vertebral arteries. No hyperdense vessel. Skull: No calvarial fracture or suspicious osseous lesion. No scalp swelling or hematoma. Sinuses/Orbits: Paranasal sinuses and mastoid air cells are predominantly clear. Included orbital structures are unremarkable. Benign senescent scleral plaque. Other: Age-indeterminate deformities of the bilateral nasal bones, favor chronic in the absence of swelling. CT CERVICAL SPINE FINDINGS Alignment: Straightening of normal cervical lordosis. Bony fusion across the C5-6 vertebral bodies. Partial fusion across the C5-6 facets on the left as well. Mild anterolisthesis of C4 on 5 is likely on a degenerative basis reflecting some adjacent segment disease. Appearance is similar to comparison MRI from January 04, 2006. No abnormally widened, jumped or perched facets. Skull base and vertebrae: Fusion of C5-6, as above. The osseous structures appear diffusely demineralized which may limit detection of small or nondisplaced  fractures. No definite acute fracture. No primary bone lesion or focal pathologic process. Multilevel Schmorl's node formations are noted. Soft tissues and spinal canal: No pre or paravertebral fluid or swelling. No visible canal hematoma. Disc levels: Advanced multilevel cervical spondylitic and facet degenerative changes throughout the cervical spine. Subchondral sclerosis and cystic change of the dens at the atlantodental articulation. Mild-to-moderate canal stenoses are noted  at C4-5 and C6-7. Additional uncinate spurring and advanced hypertrophic facet degenerative change results in mild-to-moderate multilevel neural foraminal narrowing as well. Upper chest: Extensive severe centrilobular and paraseptal emphysematous changes with some larger apical bulla. Coronal narrowing of the trachea, likely reflective of COPD, with scattered secretions. Other: Normal thyroid. Cervical carotid atherosclerosis. IMPRESSION: 1. No acute intracranial abnormality. 2. Small vessel ischemic disease and parenchymal volume loss. 3. Age-indeterminate deformities of the bilateral nasal bones, favor chronic in the absence of swelling. Correlate for point tenderness. 4. No acute fracture or traumatic listhesis of the cervical spine. 5. Advanced multilevel cervical spondylitic and facet degenerative changes. 6.  Emphysema (ICD10-J43.9) and features of COPD. 7. Cervical and intracranial atherosclerosis. Electronically Signed   By: Kreg Shropshire M.D.   On: 08/16/2019 20:07   CT ABDOMEN PELVIS W CONTRAST  Result Date: 08/16/2019 CLINICAL DATA:  Acute pain due to trauma EXAM: CT CHEST, ABDOMEN, AND PELVIS WITH CONTRAST TECHNIQUE: Multidetector CT imaging of the chest, abdomen and pelvis was performed following the standard protocol during bolus administration of intravenous contrast. CONTRAST:  75mL OMNIPAQUE IOHEXOL 350 MG/ML SOLN COMPARISON:  Oct 03, 2017. FINDINGS: CT CHEST FINDINGS Cardiovascular: Contrast injection is sufficient to demonstrate satisfactory opacification of the pulmonary arteries to the segmental level. There is no pulmonary embolus. The main pulmonary artery is dilated measuring up to approximately 3.2 cm. There is no CT evidence of acute right heart strain. There is extensive irregular calcified and noncalcified plaque at the aortic arch. The heart size is normal. Coronary artery calcifications are noted. Mediastinum/Nodes: --No mediastinal or hilar lymphadenopathy. --No axillary  lymphadenopathy. --No supraclavicular lymphadenopathy. --Normal thyroid gland. --The esophagus is unremarkable Lungs/Pleura: There is bronchial wall thickening and mucus plugging at the lung bases bilaterally, right worse than left. There is some consolidation in the medial right lower lobe. There is an airspace opacity in the medial right upper lobe measuring approximately 1.5 cm (axial series 6, image 30). This is new since 2008. There are severe emphysematous changes bilaterally. There is a 5 mm pulmonary nodule in the left lower lobe (axial series 4, image 83). This is stable since 2019 and is consistent with a benign process. There is a large calcified granuloma involving the posterior right lower lobe. The trachea is unremarkable. There is no pneumothorax. No significant pleural effusion. Musculoskeletal: There is mild height loss of the T3 vertebral body, likely chronic. There is mild height loss of the T9 vertebral body, likely chronic. Review of the MIP images confirms the above findings. CT ABDOMEN PELVIS FINDINGS Hepatobiliary: Multiple hepatic cysts are noted. These are relatively stable from prior study. Normal gallbladder.There is no biliary ductal dilation. Pancreas: Normal contours without ductal dilatation. No peripancreatic fluid collection. Spleen: No splenic laceration or hematoma. Adrenals/Urinary Tract: --Adrenal glands: No adrenal hemorrhage. --Right kidney/ureter: No hydronephrosis or perinephric hematoma. --Left kidney/ureter: There are nonobstructing stones in the lower pole the left kidney. --Urinary bladder: Unremarkable. Stomach/Bowel: --Stomach/Duodenum: Stomach is distended with an air-fluid level. --Small bowel: No dilatation or inflammation. --Colon: There is a large amount of stool in the colon. --Appendix: Normal. Vascular/Lymphatic: Atherosclerotic  calcification is present within the non-aneurysmal abdominal aorta, without hemodynamically significant stenosis. Is a probable  high-grade stenosis involving the external iliac artery on the left (axial series 2, image 54). --No retroperitoneal lymphadenopathy. --No mesenteric lymphadenopathy. --No pelvic or inguinal lymphadenopathy. Reproductive: Unremarkable Other: No ascites or free air. The abdominal wall is normal. Musculoskeletal. There is an acute displaced fracture of the proximal right femur. There are end-stage degenerative changes of both hips. IMPRESSION: 1. Acute displaced fracture of the proximal right femur. There are end-stage degenerative changes of both hips. 2. Small amount of consolidation in the medial right lower lobe with associated bronchial wall thickening and mucus plugging is suggestive of infectious or reactive bronchiolitis. 3. There is a 1.5 cm airspace opacity in the medial right upper lobe. This may represent an infiltrate, atelectasis, or pulmonary nodule. A three-month follow-up CT of the chest is recommended for further evaluation. 4. Severe emphysematous changes. 5. Large amount of stool in the colon may indicate constipation. 6. Suspected high-grade stenosis of the left external iliac artery. 7. Nonobstructive left lower pole stones. 8. Dilated main pulmonary artery which can be seen in patients with elevated pulmonary artery pressures. 9. Aortic Atherosclerosis (ICD10-I70.0) and Emphysema (ICD10-J43.9). Electronically Signed   By: Katherine Mantle M.D.   On: 08/16/2019 22:32   ECHOCARDIOGRAM COMPLETE  Result Date: 08/17/2019    ECHOCARDIOGRAM REPORT   Patient Name:   DAYMEIN NUNNERY Date of Exam: 08/17/2019 Medical Rec #:  [id number]         Height:       67.0 in Accession #:    [id number]        Weight:       102.0 lb Date of Birth:  May 21, 1933         BSA:          1.519 m Patient Age:    86 years          BP:           120/80 mmHg Patient Gender: M                 HR:           70 bpm. Exam Location:  [facility] Procedure: 2D Echo, Cardiac Doppler and Color Doppler Indications:    R07.9* Chest pain,  unspecified; R06.02 SOB  History:        Patient has no prior history of Echocardiogram examinations.  Sonographer:    Wonda Cerise Referring Phys: [id number] Andris Baumann IMPRESSIONS  1. Left ventricular ejection fraction, by estimation, is 50 to 55%. The left ventricle has low normal function. The left ventricle has no regional wall motion abnormalities. Left ventricular diastolic parameters are consistent with Grade I diastolic dysfunction (impaired relaxation).  2. Right ventricular systolic function is normal. The right ventricular size is normal. There is moderately elevated pulmonary artery systolic pressure.  3. Borderline to mild aortic valve stenosis, estimated AVA 1.5 -1.6 cm sq, mean gradient 11 mm Hg  4. Frequent ectopy noted FINDINGS  Left Ventricle: Left ventricular ejection fraction, by estimation, is 50 to 55%. The left ventricle has low normal function. The left ventricle has no regional wall motion abnormalities. The left ventricular internal cavity size was normal in size. There is no left ventricular hypertrophy. Left ventricular diastolic parameters are consistent with Grade I diastolic dysfunction (impaired relaxation). Right Ventricle: The right ventricular size is normal. No increase in right ventricular wall thickness. Right ventricular systolic function is normal. There  is moderately elevated pulmonary artery systolic pressure. The tricuspid regurgitant velocity is 3.18 m/s, and with an assumed right atrial pressure of 5 mmHg, the estimated right ventricular systolic pressure is 45.4 mmHg. Left Atrium: Left atrial size was normal in size. Right Atrium: Right atrial size was normal in size. Pericardium: There is no evidence of pericardial effusion. Mitral Valve: The mitral valve is normal in structure. Normal mobility of the mitral valve leaflets. No evidence of mitral valve regurgitation. No evidence of mitral valve stenosis. Tricuspid Valve: The tricuspid valve is normal in structure.  Tricuspid valve regurgitation is mild . No evidence of tricuspid stenosis. Aortic Valve: The aortic valve is normal in structure. Aortic valve regurgitation is not visualized. Mild aortic stenosis is present. Aortic valve mean gradient measures 10.5 mmHg. Aortic valve peak gradient measures 17.9 mmHg. Aortic valve area, by VTI measures 1.62 cm. Pulmonic Valve: The pulmonic valve was normal in structure. Pulmonic valve regurgitation is not visualized. No evidence of pulmonic stenosis. Aorta: The aortic root is normal in size and structure. Venous: The inferior vena cava is normal in size with greater than 50% respiratory variability, suggesting right atrial pressure of 3 mmHg. IAS/Shunts: No atrial level shunt detected by color flow Doppler.  LEFT VENTRICLE PLAX 2D LVIDd:         4.44 cm LVIDs:         3.09 cm LV PW:         1.27 cm LV IVS:        1.04 cm LVOT diam:     2.20 cm LV SV:         59 LV SV Index:   39 LVOT Area:     3.80 cm  LEFT ATRIUM           Index       RIGHT ATRIUM           Index LA diam:      3.30 cm 2.17 cm/m  RA Area:     15.10 cm LA Vol (A4C): 15.5 ml 10.20 ml/m RA Volume:   40.20 ml  26.47 ml/m  AORTIC VALVE AV Area (Vmax):    1.39 cm AV Area (Vmean):   1.39 cm AV Area (VTI):     1.62 cm AV Vmax:           211.50 cm/s AV Vmean:          153.500 cm/s AV VTI:            0.366 m AV Peak Grad:      17.9 mmHg AV Mean Grad:      10.5 mmHg LVOT Vmax:         77.30 cm/s LVOT Vmean:        56.200 cm/s LVOT VTI:          0.156 m LVOT/AV VTI ratio: 0.43  AORTA Ao Root diam: 3.50 cm TRICUSPID VALVE TR Peak grad:   40.4 mmHg TR Vmax:        318.00 cm/s  SHUNTS Systemic VTI:  0.16 m Systemic Diam: 2.20 cm Julien Nordmann MD Electronically signed by Julien Nordmann MD Signature Date/Time: 08/17/2019/3:40:07 PM    Final    DG HIP OPERATIVE UNILAT W OR W/O PELVIS RIGHT  Result Date: 08/17/2019 CLINICAL DATA:  Intramedullary nail placement EXAM: OPERATIVE RIGHT HIP (WITH PELVIS IF PERFORMED) 3 VIEWS  TECHNIQUE: Fluoroscopic spot image(s) were submitted for interpretation post-operatively. COMPARISON:  August 16, 2019 FINDINGS: The patient has undergone  intramedullary nail placement for the proximal right femur fracture. The alignment is improved. The hardware is intact. There are expected postsurgical changes including subcutaneous gas and overlying soft tissue edema. IMPRESSION: Status post intramedullary nail placement for proximal right femur fracture. Electronically Signed   By: Katherine Mantle M.D.   On: 08/17/2019 19:37   DG Hip Unilat W or Wo Pelvis 2-3 Views Right  Result Date: 08/16/2019 CLINICAL DATA:  Unwitnessed fall EXAM: DG HIP (WITH OR WITHOUT PELVIS) 2-3V RIGHT COMPARISON:  09/14/2017 FINDINGS: Acute intertrochanteric fracture of the proximal right femur without significant displacement or angulation. No hip dislocation. Severe degenerative changes of the bilateral hip joints. Bony pelvis appears intact. Sacrum and pelvis are partially obscured by overlying bowel gas and stool. IMPRESSION: Acute intertrochanteric fracture of the proximal right femur without significant displacement or angulation. Electronically Signed   By: Duanne Guess D.O.   On: 08/16/2019 20:27        Scheduled Meds: . Chlorhexidine Gluconate Cloth  6 each Topical Daily  . docusate sodium  100 mg Oral BID  . feeding supplement (ENSURE ENLIVE)  237 mL Oral TID BM  . ipratropium-albuterol  3 mL Nebulization Q6H  . mouth rinse  15 mL Mouth Rinse BID  . [START ON 08/19/2019] multivitamin with minerals  1 tablet Oral Daily  . tranexamic acid (CYKLOKAPRON) topical - INTRAOP  2,000 mg Topical Once   Continuous Infusions: . sodium chloride 75 mL/hr at 08/18/19 1435  . azithromycin    . cefTRIAXone (ROCEPHIN)  IV      Assessment & Plan:   Principal Problem:   Closed fracture of femur, intertrochanteric, right, initial encounter [facility]) Active Problems:   Acute on chronic respiratory failure with  hypoxia and hypercapnia (HCC)   Right middle lobe pneumonia   COPD with acute exacerbation (HCC)   Essential hypertension   Fall at home, initial encounter   Closed right hip fracture (HCC)   Pulmonary nodule   Closed fracture of femur, intertrochanteric, right, initial encounter (HCC) Accidental fall at home, initial encounter -s/p  right hip trochanteric femoral nail.  -Pain control S/p arterial ligation post op - received prbc transfusion. hemodynamic stable. Ok to transfer to [employer] floor. Cbc stable.    Acute on chronic respiratory failure with hypoxia and hypercapnia (HCC)   Right middle lobe pneumonia/community-acquired versus aspiration   COPD with acute exacerbation (HCC) -CTA chest ruled out acute PE -Right middle lobe infiltrate seen -IV Rocephin and azithromycin -Scheduled and as needed duo nebs  -covid negative -Supplemental oxygen to keep sats over 90%  Elevated troponin -Troponin was 36>>69 suspect related to demand ischemia EF 50-55%.  Cards following. Due to abn. Ecg, avoid avn blockers.  -Patient denies chest pain and has no EKG changes   BPH with LUTS and acute urinary retention -Continue tamsulosin --patient noted to have acute urinary retention while awaiting an inpatient bed with several failed attempts by Er provider to place foley. Bladder scan with at time of attempt --urology rec. Maintaining foley for 1-2 days after hip surgery given difficulty with placement.     Pulmonary nodule -CT chest in 3 months recommended by radiologist  Rheumatoid arthritis -Continue sulfasalazine   DVT prophylaxis: SCD Code Status:full code Family Communication: none at bedside Disposition Plan: needs snf Barrier: snf pending. Case mx working on it       LOS: 2 days   Time spent:45 min with >50% on coc    Lynn Ito, MD [facility] Pager  [phone]  If 7PM-7AM, please contact night-coverage [email or web address] Password  [facility] 08/18/2019, 5:04 PM Patient ID: GARVIS DOWNUM, male   DOB: 02/08/1933, 86 y.o.   MRN: [id number]

## 2019-08-19 LAB — TYPE AND SCREEN
ABO/RH(D): O POS
Antibody Screen: NEGATIVE
Unit division: 0
Unit division: 0
Unit division: 0
Unit division: 0
Unit division: 0
Unit division: 0

## 2019-08-19 LAB — BPAM RBC
Blood Product Expiration Date: 202104202359
Blood Product Expiration Date: 202104222359
Blood Product Expiration Date: 202104222359
Blood Product Expiration Date: 202104222359
Blood Product Expiration Date: 202104222359
Blood Product Expiration Date: 202104222359
ISSUE DATE / TIME: 202103212013
ISSUE DATE / TIME: 202103212039
ISSUE DATE / TIME: 202103212039
ISSUE DATE / TIME: 202103212146
Unit Type and Rh: 5100
Unit Type and Rh: 5100
Unit Type and Rh: 5100
Unit Type and Rh: 5100
Unit Type and Rh: 5100
Unit Type and Rh: 5100

## 2019-08-19 LAB — BPAM FFP
Blood Product Expiration Date: 202103262359
ISSUE DATE / TIME: 202103220057
Unit Type and Rh: 5100

## 2019-08-19 LAB — PREPARE FRESH FROZEN PLASMA

## 2019-08-19 MED ORDER — METOPROLOL TARTRATE 5 MG/5ML IV SOLN
5.0000 mg | Freq: Four times a day (QID) | INTRAVENOUS | Status: DC | PRN
  Administered 2019-08-20 – 2019-08-21 (×2): 5 mg via INTRAVENOUS
  Filled 2019-08-19 (×3): qty 5

## 2019-08-19 NOTE — Progress Notes (Signed)
PT Cancellation Note  Patient Details Name: Thomas Nicholson MRN: [id number] DOB: 16-Jun-1932   Cancelled Treatment:    Reason Eval/Treat Not Completed: Patient at procedure or test/unavailable(2 attempts to see patient for 2nd treatment this afternoon. Pt unavailable both times, RN providing 'patient care' and receiving patient to unit.)   3:46 PM, 08/19/19 Rosamaria Lints, PT, DPT Physical Therapist - [facility]  [phone] (ASCOM)   [name] 08/19/2019, 3:46 PM

## 2019-08-19 NOTE — NC FL2 (Addendum)
[place] MEDICAID FL2 LEVEL OF CARE SCREENING TOOL     IDENTIFICATION  Patient Name: Thomas Nicholson Birthdate: [birth year] Sex: male Admission Date (Current Location): 08/16/2019  [place] and [place] Number:  Chiropodist and Address:  [facility], [address] [place], [place] [address]      Provider Number: [id number]  Attending Physician Name and Address:  Lynn Ito, MD  Relative Name and Phone Number:  Collier Bullock [phone]    Current Level of Care: Hospital Recommended Level of Care: Skilled Nursing Facility Prior Approval Number:    Date Approved/Denied:   PASRR Number: [id number]  Discharge Plan: SNF    Current Diagnoses: Patient Active Problem List   Diagnosis Date Noted  . Protein-calorie malnutrition, severe 08/18/2019  . Closed fracture of femur, intertrochanteric, right, initial encounter (HCC) 08/16/2019  . Acute on chronic respiratory failure with hypoxia and hypercapnia (HCC) 08/16/2019  . Right middle lobe pneumonia 08/16/2019  . COPD with acute exacerbation (HCC) 08/16/2019  . Essential hypertension 08/16/2019  . Fall at home, initial encounter 08/16/2019  . Closed right hip fracture (HCC) 08/16/2019  . Pulmonary nodule 08/16/2019    Orientation RESPIRATION BLADDER Height & Weight     Self, Time, Situation, Place  O2(4 liters) Continent Weight: 46.3 kg Height:  5\' 7"  (170.2 cm)  BEHAVIORAL SYMPTOMS/MOOD NEUROLOGICAL BOWEL NUTRITION STATUS      Continent  Puree, Nectar Thick Liquid  AMBULATORY STATUS COMMUNICATION OF NEEDS Skin   Extensive Assist Verbally Surgical wounds                       Personal Care Assistance Level of Assistance  Dressing, Bathing Bathing Assistance: Limited assistance   Dressing Assistance: Limited assistance     Functional Limitations Info  Hearing   Hearing Info: Impaired      SPECIAL CARE FACTORS FREQUENCY  PT (By licensed PT), OT (By licensed OT)      PT Frequency: 5 times per week OT Frequency: 5 times per week            Contractures Contractures Info: Not present    Additional Factors Info  Code Status, Allergies Code Status Info: full Allergies Info: NKDA           Current Medications (08/19/2019):  This is the current hospital active medication list Current Facility-Administered Medications  Medication Dose Route Frequency Provider Last Rate Last Admin  . albuterol (PROVENTIL) (2.5 MG/3ML) 0.083% nebulizer solution 2.5 mg  2.5 mg Nebulization Q2H PRN 08/21/2019, MD   2.5 mg at 08/17/19 1058  . azithromycin (ZITHROMAX) 250 mg in dextrose 5 % 125 mL IVPB  250 mg Intravenous Q24H Stretch, 08/19/19, MD   Stopped at 08/18/19 2216  . bisacodyl (DULCOLAX) EC tablet 5 mg  5 mg Oral Daily PRN 2217, MD      . cefTRIAXone (ROCEPHIN) 2 g in sodium chloride 0.9 % 100 mL IVPB  2 g Intravenous Q24H Lyndle Herrlich, MD   Stopped at 08/18/19 1758  . Chlorhexidine Gluconate Cloth 2 % PADS 6 each  6 each Topical Daily 08/20/19, MD   6 each at 08/19/19 1459  . docusate sodium (COLACE) capsule 100 mg  100 mg Oral BID 08/21/19, MD   100 mg at 08/19/19 08/21/19  . feeding supplement (ENSURE ENLIVE) (ENSURE ENLIVE) liquid 237 mL  237 mL Oral TID BM 1025, MD   237 mL  at 08/19/19 1343  . HYDROcodone-acetaminophen (NORCO/VICODIN) 5-325 MG per tablet 1-2 tablet  1-2 tablet Oral Q6H PRN Lovell Sheehan, MD   1 tablet at 08/19/19 1439  . ipratropium-albuterol (DUONEB) 0.5-2.5 (3) MG/3ML nebulizer solution 3 mL  3 mL Nebulization Q6H Lovell Sheehan, MD   3 mL at 08/19/19 1454  . MEDLINE mouth rinse  15 mL Mouth Rinse BID Lovell Sheehan, MD   15 mL at 08/19/19 1031  . metoCLOPramide (REGLAN) tablet 5-10 mg  5-10 mg Oral Q8H PRN Lovell Sheehan, MD       Or  . metoCLOPramide (REGLAN) injection 5-10 mg  5-10 mg Intravenous Q8H PRN Lovell Sheehan, MD      . morphine 2 MG/ML injection 0.5 mg  0.5 mg Intravenous Q2H PRN  Lovell Sheehan, MD   0.5 mg at 08/18/19 0036  . multivitamin with minerals tablet 1 tablet  1 tablet Oral Daily Nolberto Hanlon, MD   1 tablet at 08/19/19 0806  . ondansetron (ZOFRAN) tablet 4 mg  4 mg Oral Q6H PRN Lovell Sheehan, MD       Or  . ondansetron [facility]) injection 4 mg  4 mg Intravenous Q6H PRN Lovell Sheehan, MD         Discharge Medications: Please see discharge summary for a list of discharge medications.  Relevant Imaging Results:  Relevant Lab Results:   Additional Information SS# [id number]  Su Hilt, RN

## 2019-08-19 NOTE — Progress Notes (Signed)
Pt rec'd from ICU.  Alert and oriented able to make needs known using the call bell and clear speech.  Reports pain to bilateral heels, upon assessment both heels are noted to be red but each will blanch.  Heels were elevated and protective dressings were applied to each heel, pt reported relief shortly after.  Skin is noted with scattered bruising and skin tears.  Foley intact draining clear straw colored urine.  VS stable currently on 4L via [place] sats 98%.  Incision to left hip dressed with aquacel old drainage noted and marked.  Pt resting soundly in bed at this time.  He has been oriented to room, call bell, phone and tv remote.  Ensure given pt prefers vanilla he states.  Call bell within reach, bed in lowest position and call bell is on.  Will monitor.

## 2019-08-19 NOTE — Evaluation (Signed)
Occupational Therapy Evaluation Patient Details Name: CRESPIN FORSTROM MRN: [id number] DOB: 23-Jan-1933 Today's Date: 08/19/2019    History of Present Illness Thomas Nicholson is an 86yoM who [facility] after a fall onto his Right side c subsequent Rt hip pain. Larey Seat trying to open a door while walking the dog, hit head upon falling without LOC. Pt also noted to have PNA. Pt now s/p ORIF of hip, WBAT per Dr. Odis Luster. PMH: COPD on home O2 at 2 L and rheumatoid arthritis.   Clinical Impression   Mr Cobern was seen for OT evaluation this date. Prior to hospital admission, pt was MOD I with ADLs using 2l O2 at baseline. Pt lives alone with his chihuahua. Pt presents to acute OT demonstrating impaired ADL performance, functional cognition, and functional mobility 2/2 functional strength/ROM/balance deficits, pain, poor task initiation, and cardiopulmonary status limiting moblity. Pt requires SETUP + MAX VCs for PLB during unilateral ADLs while reclined in bed. Pt would benefit from skilled OT to address noted impairments and functional limitations (see below for any additional details) in order to maximize safety and independence while minimizing falls risk and caregiver burden. Upon hospital discharge, recommend STR to maximize pt safety and return to PLOF.     Follow Up Recommendations  SNF    Equipment Recommendations       Recommendations for Other Services       Precautions / Restrictions Precautions Precautions: Fall Restrictions Weight Bearing Restrictions: Yes RLE Weight Bearing: Weight bearing as tolerated      Mobility Bed Mobility Overal bed mobility: Needs Assistance             General bed mobility comments: RN arrived to prepare pt for transport - OT deferred mobility testing  Transfers                      Balance                                           ADL either performed or assessed with clinical judgement   ADL  Overall ADL's : Needs assistance/impaired                                       General ADL Comments: SETUP self-drinking reclined in bed - pt knocked over other lidded cup on table (motor planning vs visual scanning?). SETUP + VCs for initiation face washing reclined in bed. HR increases (103) during minimal effort to wash face      Vision Baseline Vision/History: Wears glasses Patient Visual Report: Blurring of vision(Unclear when blurry vision ended, pt endorses after fall)       Perception     Praxis      Pertinent Vitals/Pain Pain Assessment: Faces Faces Pain Scale: Hurts little more Pain Location: R side     Hand Dominance Right   Extremity/Trunk Assessment Upper Extremity Assessment Upper Extremity Assessment: RUE deficits/detail;LUE deficits/detail RUE Deficits / Details: AROM: shoulder flexion ~95*, elbow flexion ~95*. Grip 4-/5 LUE Deficits / Details: AROM WFL grossly. Grip 4/5   Lower Extremity Assessment Lower Extremity Assessment: Generalized weakness       Communication Communication Communication: No difficulties   Cognition Arousal/Alertness: Awake/alert(intermittently closed eyes t/o session, responded to VCs) Behavior During Therapy: Flat affect;WFL  for tasks assessed/performed Overall Cognitive Status: Within Functional Limits for tasks assessed                     Current Attention Level: Divided   Following Commands: Follows one step commands consistently     Problem Solving: Slow processing;Requires verbal cues;Decreased initiation     General Comments       Exercises Exercises: Other exercises Other Exercises Other Exercises: Pt educated re: falls prevention, pet management, importance of supervision, OT role, Discharge recommendations, energy conservation strategies, PLB Other Exercises: Self-drinking, face washing, PLB   Shoulder Instructions      Home Living Family/patient expects to be discharged to::  Private residence Living Arrangements: Alone   Type of Home: House Home Access: Stairs to enter [employer] of Steps: 3 Entrance Stairs-Rails: Right(housewall on other side) Home Layout: One level     Bathroom Shower/Tub: Teacher, early years/pre: Standard     Home Equipment: Environmental consultant - 2 wheels;Wheelchair - manual;Cane - single point   Additional Comments: Has a chihuahua      Prior Functioning/Environment Level of Independence: Independent with assistive device(s)        Comments: Pt still drives, a friend brings groceries/ meals on wheels; Uses 2L O2 at home. But no AD use. Has to hold onl to furniture/walls for stability.        OT Problem List: Decreased strength;Decreased range of motion;Decreased activity tolerance;Impaired balance (sitting and/or standing);Impaired vision/perception;Decreased coordination;Decreased safety awareness;Decreased knowledge of use of DME or AE;Decreased knowledge of precautions;Cardiopulmonary status limiting activity      OT Treatment/Interventions: Self-care/ADL training;Therapeutic exercise;Neuromuscular education;Energy conservation;DME and/or AE instruction;Therapeutic activities;Cognitive remediation/compensation;Visual/perceptual remediation/compensation;Patient/family education;Balance training    OT Goals(Current goals can be found in the care plan section) Acute Rehab OT Goals Patient Stated Goal: return to home with little dog OT Goal Formulation: With patient Time For Goal Achievement: 09/02/19 Potential to Achieve Goals: Fair ADL Goals Pt Will Perform Grooming: sitting;with min assist(c no cues for rest breaks) Pt Will Perform Upper Body Dressing: with min assist;sitting(c no cues for rest breaks) Pt Will Transfer to Toilet: with max assist;with +2 assist;stand pivot transfer  OT Frequency: Min 2X/week   Barriers to D/C: Decreased caregiver support;Inaccessible home environment           Co-evaluation              AM-PAC OT "6 Clicks" Daily Activity     Outcome Measure Help from another person eating meals?: A Little Help from another person taking care of personal grooming?: A Little Help from another person toileting, which includes using toliet, bedpan, or urinal?: A Lot Help from another person bathing (including washing, rinsing, drying)?: A Lot Help from another person to put on and taking off regular upper body clothing?: A Little Help from another person to put on and taking off regular lower body clothing?: A Lot 6 Click Score: 15   End of Session Equipment Utilized During Treatment: Oxygen(4L humidified [place])  Activity Tolerance: Patient limited by fatigue Patient left: in bed;with call bell/phone within reach;with bed alarm set;with nursing/sitter in room  OT Visit Diagnosis: Unsteadiness on feet (R26.81);Other abnormalities of gait and mobility (R26.89);Muscle weakness (generalized) (M62.81)                Time: 2778-2423 OT Time Calculation (min): 20 min Charges:  OT General Charges $OT Visit: 1 Visit OT Evaluation $OT Eval Moderate Complexity: 1 Mod OT Treatments $Self Care/Home Management :  8-22 mins  Kathie Dike, M.S. OTR/L  08/19/19, 12:33 PM

## 2019-08-19 NOTE — Progress Notes (Signed)
Pt noted with a significant amt of drainage on his paper chuck as well as drainage coming out from under pts R hip dressing.  Drainage was light Dorinda Stehr in color.  Dressing was reinforced with abd pads and paper tape.  Dressing to ST to pts R elbow and wrist were also removed cleansed and redressed.  Pt tolerated reinforcement and changes without issue. Will monitor.

## 2019-08-19 NOTE — Progress Notes (Signed)
Physical Therapy Treatment Patient Details Name: Thomas Nicholson MRN: [id number] DOB: 03-Apr-1933 Today's Date: 08/19/2019    History of Present Illness Elier Zellars is an 41yoM who comes to [facility] on 3/[facility] after a fall onto his Right side c subsequent Rt hip pain. [name] trying to open a door while walking the dog, hit head upon falling without LOC. Pt also noted to have PNA. Pt now s/p ORIF of hip, WBAT per Dr. Harlow Mares. PMH: COPD on home O2 at 2 L and rheumatoid arthritis.    PT Comments    Pt in bed upon arrival, peds meds already given for Rt hip soreness, now controlled. Reviewed bed level exercises with patient, with some improved tolerance to RLE, however both limbs remain weak and require active assist at times to achieve all reps. During mobility, flow rate must be increased from 4L to 5L, SpO2 remaining in upper 80s-92%. HR is more variable, mostly in 110s with occasional runs in the 120s  Or 130s for ~5 seconds at a time. OOB deferred this date due to HR and O2 issues.    Follow Up Recommendations  SNF;Supervision for mobility/OOB;Supervision - Intermittent     Equipment Recommendations  Rolling walker with 5" wheels    Recommendations for Other Services       Precautions / Restrictions Precautions Precautions: Fall Restrictions Weight Bearing Restrictions: Yes RLE Weight Bearing: Weight bearing as tolerated    Mobility  Bed Mobility Overal bed mobility: Needs Assistance             General bed mobility comments: deferred, vitals unsafe to attempt  Transfers                    Ambulation/Gait                 Stairs             Wheelchair Mobility    Modified Rankin (Stroke Patients Only)       Balance                                            Cognition Arousal/Alertness: Awake/alert Behavior During Therapy: WFL for tasks assessed/performed Overall Cognitive Status: Within Functional Limits for tasks  assessed                     Current Attention Level: Divided   Following Commands: Follows one step commands consistently     Problem Solving: Slow processing;Requires verbal cues;Decreased initiation        Exercises General Exercises - Lower Extremity Short Arc Quad: AROM;Both;20 reps;Supine Heel Slides: AAROM;Both;20 reps;Supine Hip ABduction/ADduction: AAROM;Both;15 reps;Supine Other Exercises Other Exercises: Pt educated re: falls prevention, pet management, importance of supervision, OT role, Discharge recommendations, energy conservation strategies, PLB Other Exercises: Self-drinking, face washing, PLB    General Comments        Pertinent Vitals/Pain Pain Assessment: No/denies pain Faces Pain Scale: Hurts little more Pain Location: Right hip soreness Pain Intervention(s): Limited activity within patient's tolerance    Home Living Family/patient expects to be discharged to:: Private residence Living Arrangements: Alone   Type of Home: House Home Access: Stairs to enter Entrance Stairs-Rails: Right(housewall on other side) Home Layout: One level Home Equipment: Environmental consultant - 2 wheels;Wheelchair - manual;Cane - single point Additional Comments: Has a chihuahua    Prior Function Level of  Independence: Independent with assistive device(s)      Comments: Pt still drives, a friend brings groceries/ meals on wheels; Uses 2L O2 at home. But no AD use. Has to hold onl to furniture/walls for stability.   PT Goals (current goals can now be found in the care plan section) Acute Rehab PT Goals Patient Stated Goal: return to home with little dog '[place]' PT Goal Formulation: With patient Time For Goal Achievement: 09/01/19 Potential to Achieve Goals: Fair Progress towards PT goals: Progressing toward goals    Frequency    BID      PT Plan Current plan remains appropriate    Co-evaluation              AM-PAC PT "6 Clicks" Mobility   Outcome  Measure  Help needed turning from your back to your side while in a flat bed without using bedrails?: Total Help needed moving from lying on your back to sitting on the side of a flat bed without using bedrails?: Total Help needed moving to and from a bed to a chair (including a wheelchair)?: Total Help needed standing up from a chair using your arms (e.g., wheelchair or bedside chair)?: Total Help needed to walk in hospital room?: Total Help needed climbing 3-5 steps with a railing? : Total 6 Click Score: 6    End of Session Equipment Utilized During Treatment: Oxygen Activity Tolerance: Patient tolerated treatment well;Treatment limited secondary to medical complications (Comment)(O2 and HR poorly controlled) Patient left: in bed;with call bell/phone within reach;with SCD's reapplied Nurse Communication: Mobility status PT Visit Diagnosis: Difficulty in walking, not elsewhere classified (R26.2);Muscle weakness (generalized) (M62.81);History of falling (Z91.81)     Time: 0947-0962 PT Time Calculation (min) (ACUTE ONLY): 23 min  Charges:  $Therapeutic Exercise: 23-37 mins                     12:41 PM, 08/19/19 Rosamaria Lints, PT, DPT Physical Therapist - [facility]  [phone] (ASCOM)    Serine Kea C 08/19/2019, 12:38 PM

## 2019-08-19 NOTE — Progress Notes (Signed)
PROGRESS NOTE    Thomas Nicholson  YSA:[id number] DOB: 1932-10-14 DOA: 08/16/2019 PCP: Patient, No Pcp Per    Brief Narrative:  Thomas Nicholson is a 86 y.o. male with medical history significant for COPD on home O2 at 2 L and rheumatoid arthritis brought in following fall onto his right side while walking his dog.  He hit his head on the door but did not lose consciousness.  States he was trying to open the door but this spring to the door was broken and he ended up falling. On arrival in the emergency room he was noted to be tachycardic at 110 as well as tachypneic.  He was also hypoxic, requiring up to 5 L to keep sats in the low 90s.  His blood work was for the most part unremarkable.  Hip x-ray showed right intertrochanteric fracture.  Because of tachycardia and tachypnea he had a CTA chest that was negative for PE but showed right middle lobe pneumonia as well as a right upper lobe pulmonary nodule and several other nonacute findings.  The emergency room provider spoke with orthopedist Dr. Odis Luster.  He was started on IV antibiotics for pneumonia and duo nebs covid negative.   Consultants:   Ortho , cardiology  Procedures:   Antimicrobials:   Ceftriaxone Rocephin   Subjective: Not very hungry today.  Asking for ice cream.  No overnight issues noted.  Out of the stepdown unit to medical floor now.  Objective: Vitals:   08/19/19 0830 08/19/19 0900 08/19/19 1000 08/19/19 1100  BP: (!) 150/78 (!) 151/75 136/64 136/70  Pulse: (!) 113  99 74  Resp: 20 (!) 25 19 18   Temp:      TempSrc:      SpO2: (!) 84% 92% 98% 94%  Weight:      Height:        Intake/Output Summary (Last 24 hours) at 08/19/2019 1422 Last data filed at 08/19/2019 1200 Gross per 24 hour  Intake 2189.65 ml  Output 750 ml  Net 1439.65 ml   Filed Weights   08/16/19 1912  Weight: 46.3 kg    Examination:  General exam: Appears calm and comfortable, NAD respiratory system: Clear to auscultation.  Respiratory effort normal.  No wheezing or rhonchi Cardiovascular system: S1 & S2 heard, RRR. No JVD, murmurs, rubs, gallops or clicks.  Gastrointestinal system: Abdomen is nondistended, soft and nontender. Normal bowel sounds heard.  No rebound Central nervous system: Alert and oriented.  Grossly intact Extremities: no edema Skin: Warm dry Psychiatry: . Mood & affect appropriate current setting.     Data Reviewed: I have personally reviewed following labs and imaging studies  CBC: Recent Labs  Lab 08/16/19 1931 08/17/19 0742 08/18/19 0346  WBC 14.2* 11.4* 12.8*  NEUTROABS 11.9*  --   --   HGB 10.2* 9.2* 11.5*  HCT 34.2* 30.8* 35.1*  MCV 109.3* 109.6* 96.2  PLT 219 154 96*   Basic Metabolic Panel: Recent Labs  Lab 08/16/19 1931 08/18/19 0346  NA 140 145  K 4.4 4.1  CL 98 109  CO2 33* 31  GLUCOSE 176* 171*  BUN 22 17  CREATININE 1.25* 0.81  CALCIUM 9.4 8.1*   GFR: Estimated Creatinine Clearance: 44.5 mL/min (by C-G formula based on SCr of 0.81 mg/dL). Liver Function Tests: Recent Labs  Lab 08/16/19 1931  AST 19  ALT 14  ALKPHOS 83  BILITOT 0.6  PROT 6.4*  ALBUMIN 3.1*   No results for input(s): LIPASE, AMYLASE in  the last 168 hours. No results for input(s): AMMONIA in the last 168 hours. Coagulation Profile: Recent Labs  Lab 08/16/19 1931  INR 1.0   Cardiac Enzymes: No results for input(s): CKTOTAL, CKMB, CKMBINDEX, TROPONINI in the last 168 hours. BNP (last 3 results) No results for input(s): PROBNP in the last 8760 hours. HbA1C: No results for input(s): HGBA1C in the last 72 hours. CBG: Recent Labs  Lab 08/17/19 0801 08/17/19 2247  GLUCAP 105* 125*   Lipid Profile: No results for input(s): CHOL, HDL, LDLCALC, TRIG, CHOLHDL, LDLDIRECT in the last 72 hours. Thyroid Function Tests: No results for input(s): TSH, T4TOTAL, FREET4, T3FREE, THYROIDAB in the last 72 hours. Anemia Panel: No results for input(s): VITAMINB12, FOLATE, FERRITIN, TIBC,  IRON, RETICCTPCT in the last 72 hours. Sepsis Labs: No results for input(s): PROCALCITON, LATICACIDVEN in the last 168 hours.  Recent Results (from the past 240 hour(s))  Respiratory Panel by RT PCR (Flu A&B, Covid) - Nasopharyngeal Swab     Status: None   Collection Time: 08/16/19 10:22 PM   Specimen: Nasopharyngeal Swab  Result Value Ref Range Status   SARS Coronavirus 2 by RT PCR NEGATIVE NEGATIVE Final    Comment: (NOTE) SARS-CoV-2 target nucleic acids are NOT DETECTED. The SARS-CoV-2 RNA is generally detectable in upper respiratoy specimens during the acute phase of infection. The lowest concentration of SARS-CoV-2 viral copies this assay can detect is 131 copies/mL. A negative result does not preclude SARS-Cov-2 infection and should not be used as the sole basis for treatment or other patient management decisions. A negative result may occur with  improper specimen collection/handling, submission of specimen other than nasopharyngeal swab, presence of viral mutation(s) within the areas targeted by this assay, and inadequate number of viral copies (<131 copies/mL). A negative result must be combined with clinical observations, patient history, and epidemiological information. The expected result is Negative. Fact Sheet for Patients:  https://www.moore.com/ Fact Sheet for Healthcare Providers:  https://www.young.biz/ This test is not yet ap proved or cleared by the [place] FDA and  has been authorized for detection and/or diagnosis of SARS-CoV-2 by FDA under an Emergency Use Authorization (EUA). This EUA will remain  in effect (meaning this test can be used) for the duration of the COVID-19 declaration under Section 564(b)(1) of the Act, 21 U.S.C. section 360bbb-3(b)(1), unless the authorization is terminated or revoked sooner.    Influenza A by PCR NEGATIVE NEGATIVE Final   Influenza B by PCR NEGATIVE NEGATIVE Final    Comment:  (NOTE) The Xpert Xpress SARS-CoV-2/FLU/RSV assay is intended as an aid in  the diagnosis of influenza from Nasopharyngeal swab specimens and  should not be used as a sole basis for treatment. Nasal washings and  aspirates are unacceptable for Xpert Xpress SARS-CoV-2/FLU/RSV  testing. Fact Sheet for Patients: https://www.moore.com/ Fact Sheet for Healthcare Providers: https://www.young.biz/ This test is not yet approved or cleared by the [place] FDA and  has been authorized for detection and/or diagnosis of SARS-CoV-2 by  FDA under an Emergency Use Authorization (EUA). This EUA will remain  in effect (meaning this test can be used) for the duration of the  Covid-19 declaration under Section 564(b)(1) of the Act, 21  U.S.C. section 360bbb-3(b)(1), unless the authorization is  terminated or revoked. Performed at [facility], [address] [place], [place] [address]   Blood culture (routine x 2)     Status: None (Preliminary result)   Collection Time: 08/17/19 12:03 AM   Specimen: BLOOD  Result Value Ref Range Status   Specimen Description BLOOD BLOOD LEFT FOREARM  Final   Special Requests   Final    BOTTLES DRAWN AEROBIC AND ANAEROBIC Blood Culture adequate volume   Culture   Final    NO GROWTH 2 DAYS Performed at [facility], [address], [place], [place] [address]    Report Status PENDING  Incomplete  Blood culture (routine x 2)     Status: None (Preliminary result)   Collection Time: 08/17/19 12:03 AM   Specimen: BLOOD  Result Value Ref Range Status   Specimen Description BLOOD LEFT ANTECUBITAL  Final   Special Requests   Final    BOTTLES DRAWN AEROBIC AND ANAEROBIC Blood Culture adequate volume   Culture   Final    NO GROWTH 2 DAYS Performed at [facility], [address] [place], [place] [address]    Report Status PENDING  Incomplete  Surgical PCR screen     Status: None   Collection  Time: 08/17/19  1:31 PM   Specimen: Nasal Mucosa; Nasal Swab  Result Value Ref Range Status   MRSA, PCR NEGATIVE NEGATIVE Final   Staphylococcus aureus NEGATIVE NEGATIVE Final    Comment: (NOTE) The Xpert SA Assay (FDA approved for NASAL specimens in patients 37 years of age and older), is one component of a comprehensive surveillance program. It is not intended to diagnose infection nor to guide or monitor treatment. Performed at [facility], [address] [place], [place] [address]          Radiology Studies: DG HIP OPERATIVE [facility] Johns OR W/O PELVIS RIGHT  Result Date: 08/17/2019 CLINICAL DATA:  Intramedullary nail placement EXAM: OPERATIVE RIGHT HIP (WITH PELVIS IF PERFORMED) 3 VIEWS TECHNIQUE: Fluoroscopic spot image(s) were submitted for interpretation post-operatively. COMPARISON:  August 16, 2019 FINDINGS: The patient has undergone intramedullary nail placement for the proximal right femur fracture. The alignment is improved. The hardware is intact. There are expected postsurgical changes including subcutaneous gas and overlying soft tissue edema. IMPRESSION: Status post intramedullary nail placement for proximal right femur fracture. Electronically Signed   By: Constance Holster M.D.   On: 08/17/2019 19:37        Scheduled Meds: . Chlorhexidine Gluconate Cloth  6 each Topical Daily  . docusate sodium  100 mg Oral BID  . feeding supplement (ENSURE ENLIVE)  237 mL Oral TID BM  . ipratropium-albuterol  3 mL Nebulization Q6H  . mouth rinse  15 mL Mouth Rinse BID  . multivitamin with minerals  1 tablet Oral Daily   Continuous Infusions: . sodium chloride 75 mL/hr at 08/19/19 1335  . azithromycin Stopped (08/18/19 2216)  . cefTRIAXone (ROCEPHIN)  IV Stopped (08/18/19 1758)    Assessment & Plan:   Principal Problem:   Closed fracture of femur, intertrochanteric, right, initial encounter [facility]) Active Problems:   Acute on chronic respiratory failure with  hypoxia and hypercapnia (HCC)   Right middle lobe pneumonia   COPD with acute exacerbation ([place])   Essential hypertension   Fall at home, initial encounter   Closed right hip fracture ([place])   Pulmonary nodule   Protein-calorie malnutrition, severe   Closed fracture of femur, intertrochanteric, right, initial encounter ([place]) Accidental fall at home, initial encounter -s/p  right hip trochanteric femoral nail.  -Pain control S/p arterial ligation post op - received prbc transfusion. hemodynamic stable. Ok to transfer to [employer] floor. Check a.m. CBC    Acute on chronic respiratory failure with  hypoxia and hypercapnia (HCC)   Right middle lobe pneumonia/community-acquired versus aspiration   COPD with acute exacerbation (HCC) -CTA chest ruled out acute PE -Right middle lobe infiltrate seen -IV Rocephin and azithromycin -Scheduled and as needed duo nebs  -covid negative -Supplemental oxygen to keep sats over 90%  Elevated troponin -Troponin was 36>>69 suspect related to demand ischemia EF 50-55%.  Cards following. Due to abn. Ecg, avoid avn blockers.  -Patient denies chest pain and has no EKG changes   BPH with LUTS and acute urinary retention -Continue tamsulosin --patient noted to have acute urinary retention while awaiting an inpatient bed with several failed attempts by Er provider to place foley. Bladder scan with at time of attempt --urology rec. Maintaining foley for 1-2 days after hip surgery given difficulty with placement.  Plan: - will do trial of d/cin pt foley in am      Pulmonary nodule -CT chest in 3 months recommended by radiologist  Rheumatoid arthritis -Continue sulfasalazine   DVT prophylaxis: SCD Code Status:full code Family Communication: none at bedside Disposition Plan: needs snf Barrier: snf pending. Case mx working on it, trial of d/c foley in am to see if urinary retention resolved       LOS: 3 days   Time spent:45 min  with >50% on coc    Lynn Ito, MD [facility] Pager [phone]  If 7PM-7AM, please contact night-coverage [email or web address] Password [facility] 08/19/2019, 2:22 PM Patient ID: GLENDEL JAGGERS, male   DOB: December 27, 1932, 86 y.o.   MRN: [id number]

## 2019-08-19 NOTE — TOC Progression Note (Signed)
Transition of Care [facility]) - Progression Note    Patient Details  Name: Thomas Nicholson MRN: [id number] Date of Birth: [birth year]  Transition of Care [facility]) CM/SW Contact  Barrie Dunker, RN Phone Number: 08/19/2019, 4:01 PM  Clinical Narrative:     Barrie Dunker health to request insurance auth approval to go to SNF facility pending Ref number [id number] Faxed clinical information to [phone]  Expected Discharge Plan: Skilled Nursing Facility Barriers to Discharge: Continued Medical Work up  Expected Discharge Plan and Services Expected Discharge Plan: Skilled Nursing Facility   Discharge Planning Services: CM Consult   Living arrangements for the past 2 months: Single Family Home                 DME Arranged: N/A         HH Arranged: NA           Social Determinants of Health (SDOH) Interventions    Readmission Risk Interventions No flowsheet data found.

## 2019-08-19 NOTE — TOC Initial Note (Signed)
Transition of Care [facility]) - Initial/Assessment Note    Patient Details  Name: Thomas Nicholson MRN: [id number] Date of Birth: 05-May-1933  Transition of Care [facility]) CM/SW Contact:    Barrie Dunker, RN Phone Number: 08/19/2019, 3:57 PM  Clinical Narrative:                 Patient lives at home alone and has RW, Wheelchair, and cane, also has Home oxygen in place, FL2, PASSR and Bedsearch completed, will review bed offers once obtained, will need Vesta Mixer once chooses bed  Expected Discharge Plan: Skilled Nursing Facility Barriers to Discharge: Continued Medical Work up   Patient Goals and CMS Choice        Expected Discharge Plan and Services Expected Discharge Plan: Skilled Nursing Facility   Discharge Planning Services: CM Consult   Living arrangements for the past 2 months: Single Family Home                 DME Arranged: N/A         HH Arranged: NA          Prior Living Arrangements/Services Living arrangements for the past 2 months: Single Family Home Lives with:: Self Patient language and need for interpreter reviewed:: Yes Do you feel safe going back to the place where you live?: Yes      Need for Family Participation in Patient Care: No (Comment) Care giver support system in place?: Yes (comment) Current home services: DME(rw, cane, Wheelchair) Criminal Activity/Legal Involvement Pertinent to Current Situation/Hospitalization: No - Comment as needed  Activities of Daily Living Home Assistive Devices/Equipment: None ADL Screening (condition at time of admission) Patient's cognitive ability adequate to safely complete daily activities?: Yes Is the patient deaf or have difficulty hearing?: Yes Does the patient have difficulty seeing, even when wearing glasses/contacts?: No Does the patient have difficulty concentrating, remembering, or making decisions?: No Patient able to express need for assistance with ADLs?: Yes Does the patient have difficulty  dressing or bathing?: Yes Independently performs ADLs?: No Does the patient have difficulty walking or climbing stairs?: Yes Weakness of Legs: Both Weakness of Arms/Hands: None  Permission Sought/Granted   Permission granted to share information with : Yes, Verbal Permission Granted              Emotional Assessment Appearance:: Appears stated age Attitude/Demeanor/Rapport: Engaged Affect (typically observed): Appropriate Orientation: : Oriented to Self, Oriented to Place, Oriented to  Time, Oriented to Situation Alcohol / Substance Use: Not Applicable Psych Involvement: No (comment)  Admission diagnosis:  Closed right hip fracture (HCC) [S72.001A] Closed fracture of right hip, initial encounter (HCC) [S72.001A] Community acquired pneumonia of right lower lobe of lung [J18.9] Patient Active Problem List   Diagnosis Date Noted  . Protein-calorie malnutrition, severe 08/18/2019  . Closed fracture of femur, intertrochanteric, right, initial encounter (HCC) 08/16/2019  . Acute on chronic respiratory failure with hypoxia and hypercapnia (HCC) 08/16/2019  . Right middle lobe pneumonia 08/16/2019  . COPD with acute exacerbation (HCC) 08/16/2019  . Essential hypertension 08/16/2019  . Fall at home, initial encounter 08/16/2019  . Closed right hip fracture (HCC) 08/16/2019  . Pulmonary nodule 08/16/2019   PCP:  Patient, No Pcp Per Pharmacy:   [facility] [phone] Hassell Halim [address] [place] [address] Phone: [phone] Fax: [phone]     Social Determinants of Health (SDOH) Interventions    Readmission Risk Interventions No flowsheet data found.

## 2019-08-20 DIAGNOSIS — R000 Tachycardia, unspecified: Secondary | ICD-10-CM

## 2019-08-20 DIAGNOSIS — R911 Solitary pulmonary nodule: Secondary | ICD-10-CM

## 2019-08-20 LAB — CBC
HCT: 36.4 % — ABNORMAL LOW (ref 39.0–52.0)
Hemoglobin: 11.4 g/dL — ABNORMAL LOW (ref 13.0–17.0)
MCH: 30.9 pg (ref 26.0–34.0)
MCHC: 31.3 g/dL (ref 30.0–36.0)
MCV: 98.6 fL (ref 80.0–100.0)
Platelets: 96 10*3/uL — ABNORMAL LOW (ref 150–400)
RBC: 3.69 MIL/uL — ABNORMAL LOW (ref 4.22–5.81)
RDW: 16 % — ABNORMAL HIGH (ref 11.5–15.5)
WBC: 9 10*3/uL (ref 4.0–10.5)
nRBC: 0 % (ref 0.0–0.2)

## 2019-08-20 MED ORDER — AZITHROMYCIN 250 MG PO TABS
250.0000 mg | ORAL_TABLET | Freq: Every day | ORAL | Status: AC
  Administered 2019-08-20: 250 mg via ORAL
  Filled 2019-08-20: qty 1

## 2019-08-20 MED ORDER — MOMETASONE FURO-FORMOTEROL FUM 200-5 MCG/ACT IN AERO
2.0000 | INHALATION_SPRAY | Freq: Two times a day (BID) | RESPIRATORY_TRACT | Status: DC
  Administered 2019-08-20 – 2019-08-24 (×8): 2 via RESPIRATORY_TRACT
  Filled 2019-08-20: qty 8.8

## 2019-08-20 MED ORDER — IPRATROPIUM-ALBUTEROL 0.5-2.5 (3) MG/3ML IN SOLN
3.0000 mL | Freq: Three times a day (TID) | RESPIRATORY_TRACT | Status: DC
  Administered 2019-08-20 (×2): 3 mL via RESPIRATORY_TRACT
  Filled 2019-08-20 (×2): qty 3

## 2019-08-20 MED ORDER — SULFASALAZINE 500 MG PO TBEC
1000.0000 mg | DELAYED_RELEASE_TABLET | Freq: Two times a day (BID) | ORAL | Status: DC
  Administered 2019-08-20 – 2019-08-24 (×8): 1000 mg via ORAL
  Filled 2019-08-20 (×10): qty 2

## 2019-08-20 MED ORDER — IPRATROPIUM-ALBUTEROL 0.5-2.5 (3) MG/3ML IN SOLN: 3.0000 mL | Freq: Four times a day (QID) | RESPIRATORY_TRACT | Status: DC | PRN

## 2019-08-20 MED ORDER — PANTOPRAZOLE SODIUM 40 MG PO TBEC
40.0000 mg | DELAYED_RELEASE_TABLET | Freq: Every day | ORAL | Status: DC
  Administered 2019-08-20 – 2019-08-24 (×4): 40 mg via ORAL
  Filled 2019-08-20 (×5): qty 1

## 2019-08-20 MED ORDER — PREDNISONE 10 MG PO TABS
5.0000 mg | ORAL_TABLET | Freq: Every day | ORAL | Status: DC
  Administered 2019-08-20 – 2019-08-24 (×4): 5 mg via ORAL
  Filled 2019-08-20 (×5): qty 1

## 2019-08-20 MED ORDER — CEFDINIR 300 MG PO CAPS
300.0000 mg | ORAL_CAPSULE | Freq: Two times a day (BID) | ORAL | Status: AC
  Administered 2019-08-20 – 2019-08-22 (×4): 300 mg via ORAL
  Filled 2019-08-20 (×5): qty 1

## 2019-08-20 MED ORDER — FOLIC ACID 1 MG PO TABS
1.0000 mg | ORAL_TABLET | Freq: Every day | ORAL | Status: DC
  Administered 2019-08-20 – 2019-08-24 (×4): 1 mg via ORAL
  Filled 2019-08-20 (×5): qty 1

## 2019-08-20 MED ORDER — TAMSULOSIN HCL 0.4 MG PO CAPS
0.4000 mg | ORAL_CAPSULE | Freq: Every day | ORAL | Status: DC
  Administered 2019-08-20 – 2019-08-24 (×4): 0.4 mg via ORAL
  Filled 2019-08-20 (×5): qty 1

## 2019-08-20 MED ORDER — VITAMIN B-12 1000 MCG PO TABS
1000.0000 ug | ORAL_TABLET | Freq: Every day | ORAL | Status: DC
  Administered 2019-08-20 – 2019-08-24 (×4): 1000 ug via ORAL
  Filled 2019-08-20 (×5): qty 1

## 2019-08-20 NOTE — Progress Notes (Signed)
Pt desat into the 60s this AM when PT in to work with pt.  O2 turned up to 6L and NRB mask applied sats improved to 100%, 02 was then weened back down to 5L and [place] placed back on pt sats remained above 90%.  On 5L for some time this morning when Dr. In to assess pt he was weened back down to 3L [place].  This nurse back in room to assess o2 sats shortly after and he was back down in the low 80s, NAD noted, resting soundly in bed.  Nurse increase O2 back up to 5L [place] were pt remained in the mid to low 90s.

## 2019-08-20 NOTE — Progress Notes (Signed)
Foley has been clamped.

## 2019-08-20 NOTE — Care Management Important Message (Signed)
Important Message  Patient Details  Name: Thomas Nicholson MRN: [id number] Date of Birth: 05/04/1933   Medicare Important Message Given:  Yes Patient signed at bedside and hard copy placed in chart.      Trenton Founds, RN 08/20/2019, 9:49 AM

## 2019-08-20 NOTE — Progress Notes (Signed)
PT Cancellation Note  Patient Details Name: Thomas Nicholson MRN: [id number] DOB: 1933/03/13   Cancelled Treatment:    Reason Eval/Treat Not Completed: Medical issues which prohibited therapy;Patient not medically ready(Pt cleared for afternoon PT session by hospitalist. Author resturned for BID treatment, pt noted to be sustaining in 160s BPM HR. RNs in room attending to this. Will hold treatment, resume once appropriate.)   4:03 PM, 08/20/19 Thomas Nicholson, PT, DPT Physical Therapist - [facility]  [phone] (ASCOM)    Thomas Nicholson 08/20/2019, 4:02 PM

## 2019-08-20 NOTE — Progress Notes (Signed)
PT Cancellation Note  Patient Details Name: GLENDALE YOUNGBLOOD MRN: [id number] DOB: [birth year]   Cancelled Treatment:    Reason Eval/Treat Not Completed: Medical issues which prohibited therapy(Author arrived to room 952 for PT treatment, commenced LLE exercises, assess SpO2 as 68% on 4L. RN made aware. Moved pt to 8L/min with gradual recovery to 80% after 3 minutes.) RN called to room to assist. Primary RN notified, in room at exit. Pt moved from partial NRB back to [place] at 6L at exit, 96% SpO2.   10:14 AM, 08/20/19 Rosamaria Lints, PT, DPT Physical Therapist - [facility]  [phone] (ASCOM)     Haelyn Forgey C 08/20/2019, 10:14 AM

## 2019-08-20 NOTE — Progress Notes (Signed)
[facility]  - [place] at [facility]   PATIENT NAME: Thomas Nicholson    MR#:  [id number]  DATE OF BIRTH:  1932/10/19  SUBJECTIVE:   Patient appears frail and weak. His oxygen saturations dipped down into the 80s when tried to work with physical therapy early in the morning. Getting schedule treatment with nebulizer. Heart rate went up to 150s after breathing treatment. Patient is not in any respiratory distress or any pain.  Patient uses oxygen chronically 2-2 1/2 L at home. Currently he is on 5 L. REVIEW OF SYSTEMS:   Review of Systems  Constitutional: Negative for chills, fever and weight loss.  HENT: Negative for ear discharge, ear pain and nosebleeds.   Eyes: Negative for blurred vision, pain and discharge.  Respiratory: Positive for shortness of breath. Negative for sputum production, wheezing and stridor.   Cardiovascular: Negative for chest pain, palpitations, orthopnea and PND.  Gastrointestinal: Negative for abdominal pain, diarrhea, nausea and vomiting.  Genitourinary: Negative for frequency and urgency.  Musculoskeletal: Negative for back pain and joint pain.  Neurological: Positive for weakness. Negative for sensory change, speech change and focal weakness.  Psychiatric/Behavioral: Negative for depression and hallucinations. The patient is not nervous/anxious.    Tolerating Diet:yes Tolerating PT: rehab  DRUG ALLERGIES:  No Known Allergies  VITALS:  Blood pressure (!) 138/96, pulse (!) 167, temperature (!) 97.3 F (36.3 C), temperature source Oral, resp. rate 20, height 5\' 7"  (1.702 m), weight 46.3 kg, SpO2 93 %.  PHYSICAL EXAMINATION:   Physical Exam  GENERAL:  86 y.o.-year-old patient lying in the bed with no acute distress. Thin cachectic EYES: Pupils equal, round, reactive to light and accommodation. No scleral icterus.   HEENT: Head atraumatic, normocephalic. Oropharynx and nasopharynx clear.  NECK:  Supple, no jugular venous distention. No  thyroid enlargement, no tenderness.  LUNGS: Normal shallow breath sounds bilaterally, no wheezing, rales, rhonchi. No use of accessory muscles of respiration. emphysematous chest CARDIOVASCULAR: S1, S2 normal. No murmurs, rubs, or gallops.  ABDOMEN: Soft, nontender, nondistended. Bowel sounds present. No organomegaly or mass.  EXTREMITIES: No cyanosis, clubbing or edema b/l.    NEUROLOGIC: Cranial nerves II through XII are intact. No focal Motor or sensory deficits b/l.   PSYCHIATRIC:  patient is alert and oriented x 3.  SKIN: No obvious rash, lesion, or ulcer.per RN  LABORATORY PANEL:  CBC Recent Labs  Lab 08/20/19 0519  WBC 9.0  HGB 11.4*  HCT 36.4*  PLT 96*    Chemistries  Recent Labs  Lab 08/16/19 1931 08/16/19 1931 08/18/19 0346  NA 140   < > 145  K 4.4   < > 4.1  CL 98   < > 109  CO2 33*   < > 31  GLUCOSE 176*   < > 171*  BUN 22   < > 17  CREATININE 1.25*   < > 0.81  CALCIUM 9.4   < > 8.1*  AST 19  --   --   ALT 14  --   --   ALKPHOS 83  --   --   BILITOT 0.6  --   --    < > = values in this interval not displayed.   Cardiac Enzymes No results for input(s): TROPONINI in the last 168 hours. RADIOLOGY:  No results found. ASSESSMENT AND PLAN:  Thomas Wheller Fergusonis a 86 y.o.malewith medical history significant forCOPD on home O2 at 2 L and rheumatoid arthritis brought in following fall onto his  right side while walking his dog. He hit his head on the door but did not lose consciousness. Because of tachycardia and tachypnea he had a CTA chest that was negative for PE but showed right middle lobe pneumonia as well as a right upper lobe pulmonary nodule and several other nonacute findings.  Closed fracture of femur, intertrochanteric, right, initial encounter ([place]) Accidental fall at home, initial encounter -s/p  right hip trochanteric femoral nail. -Pain control -S/p arterial ligation of Profunda branch by Vascular surgery Dr Trula Slade post op - received prbc  transfusion. hemodynamic stable.  -hgb stable -patient did receive blood transfusion.  Acute on chronic respiratory failure with hypoxia and hypercapnia (HCC) Right middle lobe pneumonia/community-acquired versus aspiration with mucus plugging noted on CT chest COPD with acute exacerbation (HCC) -CTA chest was negative for PE but showed right middle lobe pneumonia as well as a right upper lobe pulmonary nodule and several other nonacute findings. -Right middle lobe infiltrate seen -IV Rocephin and azithromycin-- change to oral -Scheduled and as needed duo nebs  -covid negative -Supplemental oxygen to keep sats over 90% -change duo nebs to PRN to avoid tachycardia  Elevated troponin -Troponin was 36>>69suspect related to demand ischemia EF 50-55%.  Cards following. Due to abn. Ecg, avoid avn blockers.  -Patient denies chest pain and has no EKG changes  BPHwith LUTS and acute urinary retention -Continue tamsulosin --patient noted to have acute urinary retention while awaiting an inpatient bed with several failed attempts by Er provider to place foley. Bladder scan with 438mls at time of attempt --urology rec. Maintaining foley for 1-2 days after hip surgery given difficulty with placement.  - will do trial of d/cin pt foley in am   Pulmonary nodule -CT chest in 3 months recommended by radiologist  Rheumatoid arthritis -Continue sulfasalazine   DVT prophylaxis: SCD Code Status:full code Family Communication: none at bedside Disposition Plan:  patient lives at home by himself.  Barrier: snf pending. Case mx working on it, patient currently not safe for discharge. He had episodes of knee saturations. Oxygen requirement is 5 L nasal cannula. Hoping once respiratory status improves in 1 to 2 days he should be able to discharge  TOTAL TIME TAKING CARE OF THIS PATIENT: *30* minutes.  >50% time spent on counselling and coordination of care  Note: This dictation was  prepared with Dragon dictation along with smaller phrase technology. Any transcriptional errors that result from this process are unintentional.  Fritzi Mandes M.D    [facility]   CC: Primary care physician; Patient, No Pcp PerPatient ID: Thomas Nicholson, male   DOB: [birth year], 86 y.o.   MRN: [id number]

## 2019-08-20 NOTE — Progress Notes (Signed)
Nurse rec'd call from tele monitoring that pts HR was sustaining in the 170s.  Nurse at bedside with RT who just finished up a breathing tx.  Pt observed resting soundly in bed breathing even and unlabored. NAD noted pt slightly confused but easily reoriented.  HR continued to remain in 160s-170s MD notified and PRN metoprolol was administered via IV.  MD at bedside shortly after, new orders to change breathing tx to PRN and monitor pt.

## 2019-08-20 NOTE — Progress Notes (Signed)
Pt noted resting soundly with eyes closed NAD HR has decreased into the mid to upper 80s, O2 stable at 92% on 5L [place].

## 2019-08-20 NOTE — TOC Progression Note (Signed)
Transition of Care [facility]) - Progression Note    Patient Details  Name: Thomas Nicholson MRN: [id number] Date of Birth: Mar 13, 1933  Transition of Care [facility]) CM/SW Contact  Barrie Dunker, RN Phone Number: 08/20/2019, 1:33 PM  Clinical Narrative:    Spoke with the patient and reviewed bed offers, he chose [facility], I notified Kelly at [facility] we received auth approval from [place] and awaiting medical stability, will get new covid test prior to DC   Expected Discharge Plan: Skilled Nursing Facility Barriers to Discharge: Continued Medical Work up  Expected Discharge Plan and Services Expected Discharge Plan: Skilled Nursing Facility   Discharge Planning Services: CM Consult   Living arrangements for the past 2 months: Single Family Home                 DME Arranged: N/A         HH Arranged: NA           Social Determinants of Health (SDOH) Interventions    Readmission Risk Interventions No flowsheet data found.

## 2019-08-20 NOTE — TOC Progression Note (Signed)
Transition of Care [facility]) - Progression Note    Patient Details  Name: Thomas Nicholson MRN: [id number] Date of Birth: [birth year]  Transition of Care [facility]) CM/SW Contact  Barrie Dunker, RN Phone Number: 08/20/2019, 12:12 PM  Clinical Narrative:     Received approval from insurance for Auth  Expected Discharge Plan: Skilled Nursing Facility Barriers to Discharge: Continued Medical Work up  Expected Discharge Plan and Services Expected Discharge Plan: Skilled Nursing Facility   Discharge Planning Services: CM Consult   Living arrangements for the past 2 months: Single Family Home                 DME Arranged: N/A         HH Arranged: NA           Social Determinants of Health (SDOH) Interventions    Readmission Risk Interventions No flowsheet data found.

## 2019-08-21 ENCOUNTER — Inpatient Hospital Stay: Payer: Medicare Other

## 2019-08-21 DIAGNOSIS — W19XXXA Unspecified fall, initial encounter: Secondary | ICD-10-CM

## 2019-08-21 DIAGNOSIS — R000 Tachycardia, unspecified: Secondary | ICD-10-CM | POA: Insufficient documentation

## 2019-08-21 DIAGNOSIS — J9622 Acute and chronic respiratory failure with hypercapnia: Secondary | ICD-10-CM

## 2019-08-21 DIAGNOSIS — I479 Paroxysmal tachycardia, unspecified: Secondary | ICD-10-CM

## 2019-08-21 DIAGNOSIS — Y92009 Unspecified place in unspecified non-institutional (private) residence as the place of occurrence of the external cause: Secondary | ICD-10-CM

## 2019-08-21 DIAGNOSIS — J9621 Acute and chronic respiratory failure with hypoxia: Secondary | ICD-10-CM

## 2019-08-21 DIAGNOSIS — Z7189 Other specified counseling: Secondary | ICD-10-CM

## 2019-08-21 DIAGNOSIS — Z515 Encounter for palliative care: Secondary | ICD-10-CM

## 2019-08-21 DIAGNOSIS — E43 Unspecified severe protein-calorie malnutrition: Secondary | ICD-10-CM

## 2019-08-21 DIAGNOSIS — J441 Chronic obstructive pulmonary disease with (acute) exacerbation: Secondary | ICD-10-CM

## 2019-08-21 DIAGNOSIS — Z66 Do not resuscitate: Secondary | ICD-10-CM

## 2019-08-21 LAB — COMPREHENSIVE METABOLIC PANEL
ALT: 18 U/L (ref 0–44)
AST: 19 U/L (ref 15–41)
Albumin: 2.5 g/dL — ABNORMAL LOW (ref 3.5–5.0)
Alkaline Phosphatase: 54 U/L (ref 38–126)
Anion gap: 7 (ref 5–15)
BUN: 20 mg/dL (ref 8–23)
CO2: 32 mmol/L (ref 22–32)
Calcium: 10 mg/dL (ref 8.9–10.3)
Chloride: 102 mmol/L (ref 98–111)
Creatinine, Ser: 0.67 mg/dL (ref 0.61–1.24)
GFR calc Af Amer: >60 mL/min (ref >60)
GFR calc non Af Amer: >60 mL/min (ref >60)
Glucose, Bld: 144 mg/dL — ABNORMAL HIGH (ref 70–99)
Potassium: 4.5 mmol/L (ref 3.5–5.1)
Sodium: 141 mmol/L (ref 135–145)
Total Bilirubin: 0.9 mg/dL (ref 0.3–1.2)
Total Protein: 5.6 g/dL — ABNORMAL LOW (ref 6.5–8.1)

## 2019-08-21 LAB — BRAIN NATRIURETIC PEPTIDE: B Natriuretic Peptide: 2838 pg/mL — ABNORMAL HIGH (ref 0.0–100.0)

## 2019-08-21 MED ORDER — FLEET ENEMA 7-19 GM/118ML RE ENEM
1.0000 | ENEMA | Freq: Every day | RECTAL | Status: DC | PRN
  Administered 2019-08-21: 1 via RECTAL

## 2019-08-21 MED ORDER — FUROSEMIDE 10 MG/ML IJ SOLN
40.0000 mg | Freq: Two times a day (BID) | INTRAMUSCULAR | Status: DC
  Administered 2019-08-21: 40 mg via INTRAVENOUS
  Filled 2019-08-21 (×2): qty 4

## 2019-08-21 MED ORDER — ALPRAZOLAM 0.25 MG PO TABS: 0.2500 mg | ORAL_TABLET | Freq: Two times a day (BID) | ORAL | Status: DC | PRN

## 2019-08-21 MED ORDER — BISOPROLOL FUMARATE 5 MG PO TABS
5.0000 mg | ORAL_TABLET | Freq: Every day | ORAL | Status: DC
  Administered 2019-08-21 – 2019-08-24 (×3): 5 mg via ORAL
  Filled 2019-08-21 (×4): qty 1

## 2019-08-21 MED ORDER — FUROSEMIDE 10 MG/ML IJ SOLN: 20.0000 mg | Freq: Three times a day (TID) | INTRAMUSCULAR | Status: DC

## 2019-08-21 MED ORDER — FUROSEMIDE 10 MG/ML IJ SOLN
40.0000 mg | Freq: Once | INTRAMUSCULAR | Status: AC
  Administered 2019-08-21: 40 mg via INTRAVENOUS

## 2019-08-21 MED ORDER — METOPROLOL TARTRATE 5 MG/5ML IV SOLN
5.0000 mg | Freq: Four times a day (QID) | INTRAVENOUS | Status: DC | PRN
  Administered 2019-08-21: 5 mg via INTRAVENOUS
  Filled 2019-08-21: qty 5

## 2019-08-21 MED ORDER — FUROSEMIDE 10 MG/ML IJ SOLN
20.0000 mg | Freq: Two times a day (BID) | INTRAMUSCULAR | Status: DC
  Administered 2019-08-21: 20 mg via INTRAVENOUS
  Filled 2019-08-21: qty 4

## 2019-08-21 MED ORDER — MAGNESIUM CITRATE PO SOLN
1.0000 | Freq: Once | ORAL | Status: AC
  Administered 2019-08-21: 1 via ORAL
  Filled 2019-08-21: qty 296

## 2019-08-21 MED ORDER — FUROSEMIDE 10 MG/ML IJ SOLN
INTRAMUSCULAR | Status: AC
  Filled 2019-08-21: qty 4

## 2019-08-21 NOTE — Progress Notes (Signed)
Physical Therapy Treatment Patient Details Name: Thomas Nicholson MRN: [id number] DOB: 08-17-1932 Today's Date: 08/21/2019    History of Present Illness Thomas Nicholson is an 53yoM who comes to [facility] on 3/20 after a fall onto his Right side c subsequent Rt hip pain. [name] trying to open a door while walking the dog, hit head upon falling without LOC. Pt also noted to have PNA. Pt now s/p ORIF of hip, WBAT per Dr. Harlow Mares. PMH: COPD on home O2 at 2 L and rheumatoid arthritis. Pt was having  more difficulty with O2 sats and sinus tachycardia on POD3, noted to also have elevated BNP.    PT Comments    Pt in bed upon entry, appears much more lethargic, low energy, slurring words more. RN reports more disorientation this date and overnight. Pt agreeable to participate. Strength appears to be mildly improved in BLE, but pt falls asleep intermittently during bed level exercises. Author decided to move to seated activity with patient as this was precluded by medical complication yesterday. Pt much more alert at EOB, still requires maxA to get to EOB, initially very weak, but eventually able to sit with support of 1 hand. HR remains 110s-125bpm while up, similar to POD1-2 in ICU with this author. Pt on 5L upon entry, fails ween to 4L with sats at 87% after 4 minutes. ModA to rise to standing, noted crepitus in multiple joints, but pt appears to tolerate fairly well and is highly motivated. Pt assisted with dependent transfer to chair, pt able to take 7-8 small steps without cue.     Follow Up Recommendations  SNF;Supervision for mobility/OOB;Supervision - Intermittent     Equipment Recommendations  Rolling walker with 5" wheels    Recommendations for Other Services       Precautions / Restrictions      Mobility  Bed Mobility Overal bed mobility: Needs Assistance Bed Mobility: Supine to Sit;Sit to Supine     Supine to sit: Max assist        Transfers Overall transfer level: Needs  assistance Equipment used: (dependent style transfer) Transfers: Sit to/from [facility] Sit to Stand: Mod assist(light mod) Stand pivot transfers: Mod assist(light mod)          Ambulation/Gait                 Stairs             Wheelchair Mobility    Modified Rankin (Stroke Patients Only)       Balance   Sitting-balance support: Single extremity supported Sitting balance-Leahy Scale: Fair Sitting balance - Comments: appears weak, but is able to remain upright with trunk control/kyphosis.   Standing balance support: During functional activity;Bilateral upper extremity supported Standing balance-Leahy Scale: Poor                              Cognition Arousal/Alertness: Lethargic Behavior During Therapy: WFL for tasks assessed/performed Overall Cognitive Status: Within Functional Limits for tasks assessed Area of Impairment: (more drowsy and confused this date.)                                      Exercises General Exercises - Lower Extremity Heel Slides: AAROM;Both;Supine;15 reps;PROM Heel Slides Limitations: able to perform with minimal assist, but falls asleep intermittently becoming P/ROM. Hip ABduction/ADduction: AAROM;Both;15 reps;Supine;PROM Hip Abduction/Adduction Limitations:  able to perform with minimal assist, but falls asleep intermittently becoming P/ROM.    General Comments        Pertinent Vitals/Pain Pain Assessment: Faces Faces Pain Scale: Hurts little more Pain Location: grimacing with Right hip ROM in bed    Home Living                      Prior Function            PT Goals (current goals can now be found in the care plan section) Acute Rehab PT Goals Patient Stated Goal: return to home with little dog '[place]' PT Goal Formulation: With patient Time For Goal Achievement: 09/01/19 Potential to Achieve Goals: Fair Progress towards PT goals: Progressing toward  goals    Frequency    BID      PT Plan Current plan remains appropriate    Co-evaluation              AM-PAC PT "6 Clicks" Mobility   Outcome Measure  Help needed turning from your back to your side while in a flat bed without using bedrails?: A Lot Help needed moving from lying on your back to sitting on the side of a flat bed without using bedrails?: A Lot Help needed moving to and from a bed to a chair (including a wheelchair)?: A Lot Help needed standing up from a chair using your arms (e.g., wheelchair or bedside chair)?: A Lot Help needed to walk in hospital room?: Total Help needed climbing 3-5 steps with a railing? : Total 6 Click Score: 10    End of Session Equipment Utilized During Treatment: Oxygen Activity Tolerance: Patient tolerated treatment well;Treatment limited secondary to medical complications (Comment)(HR remains elevated 110s-120s when up.) Patient left: in bed;with call bell/phone within reach;with SCD's reapplied Nurse Communication: Mobility status PT Visit Diagnosis: Difficulty in walking, not elsewhere classified (R26.2);Muscle weakness (generalized) (M62.81);History of falling (Z91.81)     Time: 4098-1191 PT Time Calculation (min) (ACUTE ONLY): 23 min  Charges:  $Gait Training: 8-22 mins $Therapeutic Exercise: 8-22 mins                     9:37 AM, 08/21/19 Rosamaria Lints, PT, DPT Physical Therapist - [facility]  [phone] (ASCOM)    Kelechi Orgeron C 08/21/2019, 9:30 AM

## 2019-08-21 NOTE — Progress Notes (Signed)
PT Cancellation Note  Patient Details Name: Thomas Nicholson MRN: [id number] DOB: 07-13-1932   Cancelled Treatment:    Reason Eval/Treat Not Completed: Fatigue/lethargy limiting ability to participate(Author attempting 2nd treatment this date, pt more lethargic and confused. Given limited ability to remain awake and participate earlier in day, will hold treatment at this time, resume again once appropriate.)   3:23 PM, 08/21/19 Rosamaria Lints, PT, DPT Physical Therapist - [facility]  [phone] (ASCOM)     [name] 08/21/2019, 3:23 PM

## 2019-08-21 NOTE — Progress Notes (Signed)
Overnight Patient with development/recurrence of wide complex sinus tachycardia rate 150-160, no response from prn metopralol ordered previously reported from nurses.,   BP stable, alert and oriented  Noted patient with grade 1 diastolic dysfunction and aortic stenosis. Fluid balance documented + 6 liters.   No effect with vagal maneuver,  EKG wide complex tachycardia rate 152. RBBB (not new).  Chest x-ray increased bilateral haziness  BNP pending One dose IV furosemide 40 mg ordered, foley already in place for strict I & O

## 2019-08-21 NOTE — Progress Notes (Signed)
   Patient Details Name: LAKSHYA MCGILLICUDDY MRN: [id number] DOB: Oct 23, 1932  Author walking in hallway, could visualize patient trying to mobilize out of recliner back to bed without assistance. Author entered room, pt asked for assist back to bed. Author assisted with stand pivot transfer back to bed, modA. Bed alarm on. O2 donned at 5L. HR similar to earlier. Noted area of left elbow superficial edema with skin translucency, IV no longer attached, but access intact. Also noted significant amount of serous hay-colored drainage in recliner onto chuck pad, from surgical hip wound, dressing in place noted to also have central blistering, fluid pocketing from drainage. MD made aware.  10:37 AM, 08/21/19 Rosamaria Lints, PT, DPT Physical Therapist - [facility]  [phone] (ASCOM)    Glorianna Gott C 08/21/2019, 10:33 AM

## 2019-08-21 NOTE — Progress Notes (Signed)
Per NP foley is going to stay in.

## 2019-08-21 NOTE — Progress Notes (Signed)
Progress Note  Patient Name: Thomas Nicholson Date of Encounter: 08/21/2019  Primary Cardiologist: [facility] [place]  Subjective   Dealing w/ constipation this AM.  Had PAT earlier this AM w/ recurrent tachycardia and rates to the 170's while straining to have BM.  He denies c/p, palpitations.  Mild dyspnea.  Inpatient Medications    Scheduled Meds: . bisoprolol  5 mg Oral Daily  . cefdinir  300 mg Oral Q12H  . Chlorhexidine Gluconate Cloth  6 each Topical Daily  . docusate sodium  100 mg Oral BID  . feeding supplement (ENSURE ENLIVE)  237 mL Oral TID BM  . folic acid  1 mg Oral Daily  . furosemide  40 mg Intravenous BID  . mouth rinse  15 mL Mouth Rinse BID  . mometasone-formoterol  2 puff Inhalation BID  . multivitamin with minerals  1 tablet Oral Daily  . pantoprazole  40 mg Oral Daily  . predniSONE  5 mg Oral Daily  . sulfaSALAzine  1,000 mg Oral BID  . tamsulosin  0.4 mg Oral Daily  . cyanocobalamin  1,000 mcg Oral Daily   Continuous Infusions:  PRN Meds: albuterol, bisacodyl, HYDROcodone-acetaminophen, ipratropium-albuterol, metoCLOPramide **OR** metoCLOPramide (REGLAN) injection, metoprolol tartrate, morphine injection, ondansetron **OR** ondansetron (ZOFRAN) IV, sodium phosphate   Vital Signs    Vitals:   08/21/19 0607 08/21/19 0746 08/21/19 0905 08/21/19 0910  BP:  136/71    Pulse: 93 73    Resp:  16    Temp:  98 F (36.7 C)    TempSrc:  Oral    SpO2:  99% (!) 87% 91%  Weight:      Height:        Intake/Output Summary (Last 24 hours) at 08/21/2019 1254 Last data filed at 08/21/2019 1000 Gross per 24 hour  Intake -  Output 2325 ml  Net -2325 ml   Filed Weights   08/16/19 1912  Weight: 46.3 kg    Physical Exam   GEN: Frail, in no acute distress.  HEENT: Grossly normal.  Neck: Supple, no JVD, carotid bruits, or masses. Cardiac: RRR,tachycardic, no murmurs, rubs, or gallops. No clubbing, cyanosis, edema.  Radials/DP/PT 1+ and equal bilaterally.   Respiratory:  Respirations regular and unlabored, diminished breath sounds bilat. GI: Soft, nontender, nondistended, BS + x 4. MS: no deformity or atrophy. Skin: warm and dry, no rash. Neuro:  Strength and sensation are intact. Psych: AAOx3.  Flat affect.  Labs    Chemistry Recent Labs  Lab 08/16/19 1931 08/18/19 0346 08/21/19 0542  NA 140 145 141  K 4.4 4.1 4.5  CL 98 109 102  CO2 33* 31 32  GLUCOSE 176* 171* 144*  BUN 22 17 20   CREATININE 1.25* 0.81 0.67  CALCIUM 9.4 8.1* 10.0  PROT 6.4*  --  5.6*  ALBUMIN 3.1*  --  2.5*  AST 19  --  19  ALT 14  --  18  ALKPHOS 83  --  54  BILITOT 0.6  --  0.9  GFRNONAA 53* >60 >60  GFRAA >60 >60 >60  ANIONGAP 9 5 7      Hematology Recent Labs  Lab 08/17/19 0742 08/18/19 0346 08/20/19 0519  WBC 11.4* 12.8* 9.0  RBC 2.81* 3.65* 3.69*  HGB 9.2* 11.5* 11.4*  HCT 30.8* 35.1* 36.4*  MCV 109.6* 96.2 98.6  MCH 32.7 31.5 30.9  MCHC 29.9* 32.8 31.3  RDW 11.9 16.7* 16.0*  PLT 154 96* 96*    Cardiac Enzymes  Recent Labs  Lab 08/16/19 1931 08/16/19 2222  TROPONINIHS 36* 69*      BNP Recent Labs  Lab 08/21/19 0542  BNP 2,838.0*      Radiology    DG Chest 1 View  Result Date: 08/21/2019 CLINICAL DATA:  Fracture right femur.  Tachycardia. EXAM: CHEST  1 VIEW COMPARISON:  CT chest 08/16/2019.  Chest x-ray 08/16/2019. FINDINGS: Mediastinum and hilar structures are unremarkable. Heart size stable. Bilateral pulmonary infiltrates/edema and bilateral pleural effusions noted on today's exam. CHF could present this fashion. No pneumothorax. Mild gastric distention. Degenerative change thoracic spine and both shoulders. IMPRESSION: 1. Bilateral pulmonary infiltrates/edema bilateral pleural effusions noted on today's exam. CHF could present this fashion. 2.  Mild gastric distention. Electronically Signed   By: Maisie Fus  Register   On: 08/21/2019 05:37   DG HIP OPERATIVE UNILAT W OR W/O PELVIS RIGHT  Result Date: 08/17/2019 CLINICAL  DATA:  Intramedullary nail placement EXAM: OPERATIVE RIGHT HIP (WITH PELVIS IF PERFORMED) 3 VIEWS TECHNIQUE: Fluoroscopic spot image(s) were submitted for interpretation post-operatively. COMPARISON:  August 16, 2019 FINDINGS: The patient has undergone intramedullary nail placement for the proximal right femur fracture. The alignment is improved. The hardware is intact. There are expected postsurgical changes including subcutaneous gas and overlying soft tissue edema. IMPRESSION: Status post intramedullary nail placement for proximal right femur fracture. Electronically Signed   By: Katherine Mantle M.D.   On: 08/17/2019 19:37    Telemetry    Sinus rhythm, sinus tachycardia w/ freq PACs and prolonged run of atrial tachycardia to 150-160 from 0410  0542 this AM - Personally Reviewed  Cardiac Studies   2D Echocardiogram 3.21.2021   1. Left ventricular ejection fraction, by estimation, is 50 to 55%. The  left ventricle has low normal function. The left ventricle has no regional  wall motion abnormalities. Left ventricular diastolic parameters are  consistent with Grade I diastolic  dysfunction (impaired relaxation).   2. Right ventricular systolic function is normal. The right ventricular  size is normal. There is moderately elevated pulmonary artery systolic  pressure.   3. Borderline to mild aortic valve stenosis, estimated AVA 1.5 -1.6 cm  sq, mean gradient 11 mm Hg   4. Frequent ectopy noted    Patient Profile     86 y.o. male w/ a h/o COPD on home O2 and rheumatoid arthritis, who was admitted 08/16/2019 following fall and R prox femur fx s/p IM Nail on 3/21.  Post-op tx for RML PNA  Assessment & Plan    1.  R Femur Fx: s/p IM Nail.  Per ortho.  2.  PAT:  Pt w/ sinus tach and very frequent PACs @ baseline.  Three episodes of more persistent atrial tachycardia w/ rate to the 170's currently in the setting of straining to have BM (s/p enema).  Earlier this AM, had persistent atrial  tach x ~ 1hr 35 mins  Rate slowed following IV metoprolol.  Pt w/ baseline bifascicular block (RBBB/LAFB) and thus AVN blockers were avoided previously but as he's had recurrent tachycardia w/o other evidence of heart block, we will add bisoprolol 5mg  daily to start today.  Cont metoprolol 5mg  IV prn recurrent tachycardia.  If  blocker insufficient for control of PAT, may need to consider AAD.  3.  Acute diastolic CHF:  +3.8L for admission.  CXR this AM w/ bilat pulm infiltrates/edema and bilat pl effusions.  Diminished breath sounds on exam.  Very frail.  Lasix ordered this AM @ 20mg  q8h, will  change to 40 BID.  HR mgmt important in setting of diast dysfxn.  See #2.  4.  RML PNA:  abx per IM.  5.  COPD:  Steroids/nebs per IM.  Signed, Nicolasa Ducking, NP  08/21/2019, 12:54 PM    For questions or updates, please contact   Please consult [email or web address] for contact info under Cardiology/STEMI.

## 2019-08-21 NOTE — Progress Notes (Signed)
Thomas Nicholson NAME: Thomas Nicholson    MR#:  [id number]  DATE OF BIRTH:  11-Aug-1932  SUBJECTIVE:   Patient appears frail and weak. His oxygen saturations dipped down into the 80s when tried to work with physical therapy early in the morning. Patient desaturate very easily. His heart rate went up in the 160s after he got suppository. He felt better after having a BM. Purse lip breathing-- some anxiety Patient uses oxygen chronically 2-2 1/2 L at home. Currently he is on 5 L. REVIEW OF SYSTEMS:   Review of Systems  Constitutional: Negative for chills, fever and weight loss.  HENT: Negative for ear discharge, ear pain and nosebleeds.   Eyes: Negative for blurred vision, pain and discharge.  Respiratory: Positive for shortness of breath. Negative for sputum production, wheezing and stridor.   Cardiovascular: Positive for orthopnea and PND. Negative for chest pain and palpitations.  Gastrointestinal: Negative for abdominal pain, diarrhea, nausea and vomiting.  Genitourinary: Negative for frequency and urgency.  Musculoskeletal: Negative for back pain and joint pain.  Neurological: Positive for weakness. Negative for sensory change, speech change and focal weakness.  Psychiatric/Behavioral: Negative for depression and hallucinations. The patient is nervous/anxious.    Tolerating Diet:yes Tolerating PT: rehab  DRUG ALLERGIES:  No Known Allergies  VITALS:  Blood pressure 136/71, pulse 73, temperature 98 F (36.7 C), temperature source Oral, resp. rate 16, height 5\' 7"  (1.702 m), weight 46.3 kg, SpO2 91 %.  PHYSICAL EXAMINATION:   Physical Exam  GENERAL:  86 y.o.-year-old patient lying in the bed with all to moderate respiratory acute distress. Thin cachectic chronically ill deconditioned EYES: Pupils equal, round, reactive to light and accommodation. No scleral icterus.   HEENT: Head atraumatic, normocephalic. Oropharynx and  nasopharynx clear.  NECK:  Supple, no jugular venous distention. No thyroid enlargement, no tenderness.  LUNGS: creased and shallow breath sounds bilaterally, no wheezing, rales, rhonchi. No use of accessory muscles of respiration. emphysematous chest CARDIOVASCULAR: S1, S2 normal. No murmurs, rubs, or gallops. Tachycardia ABDOMEN: Soft, nontender, nondistended. Bowel sounds present. No organomegaly or mass. Foley+ EXTREMITIES: No cyanosis, clubbing or edema b/l.    NEUROLOGIC: very weak and deconditioned. Grossly nonfocal.  PSYCHIATRIC:  patient is alert and oriented x 3.  SKIN: No obvious rash, lesion, or ulcer.per RN  LABORATORY PANEL:  CBC Recent Labs  Lab 08/20/19 0519  WBC 9.0  HGB 11.4*  HCT 36.4*  PLT 96*    Chemistries  Recent Labs  Lab 08/21/19 0542  NA 141  K 4.5  CL 102  CO2 32  GLUCOSE 144*  BUN 20  CREATININE 0.67  CALCIUM 10.0  AST 19  ALT 18  ALKPHOS 54  BILITOT 0.9   Cardiac Enzymes No results for input(s): TROPONINI in the last 168 hours. RADIOLOGY:  DG Chest 1 View  Result Date: 08/21/2019 CLINICAL DATA:  Fracture right femur.  Tachycardia. EXAM: CHEST  1 VIEW COMPARISON:  CT chest 08/16/2019.  Chest x-ray 08/16/2019. FINDINGS: Mediastinum and hilar structures are unremarkable. Heart size stable. Bilateral pulmonary infiltrates/edema and bilateral pleural effusions noted on today's exam. CHF could present this fashion. No pneumothorax. Mild gastric distention. Degenerative change thoracic spine and both shoulders. IMPRESSION: 1. Bilateral pulmonary infiltrates/edema bilateral pleural effusions noted on today's exam. CHF could present this fashion. 2.  Mild gastric distention. Electronically Signed   By: Marcello Moores  Register   On: 08/21/2019 05:37   ASSESSMENT AND PLAN:  Thomas Nicholson  Fergusonis a 86 y.o.malewith medical history significant forCOPD on home O2 at 2 L and rheumatoid arthritis brought in following fall onto his right side while walking his dog.  He hit his head on the door but did not lose consciousness. Because of tachycardia and tachypnea he had a CTA chest that was negative for PE but showed right middle lobe pneumonia as well as a right upper lobe pulmonary nodule and several other nonacute findings.  Closed fracture of femur, intertrochanteric, right, initial encounter (HCC) Accidental fall at home, initial encounter -s/p  right hip trochanteric femoral nail. -Pain control -S/p arterial ligation of Profunda branch by Vascular surgery Dr Myra Gianotti post op - received prbc transfusion. hemodynamic stable.  -hgb stable -patient did receive one unit blood transfusion.  Acute on chronic respiratory failure with hypoxia and hypercapnia (HCC) Right middle lobe pneumonia/community-acquired versus aspiration with mucus plugging noted on CT chest COPD with acute exacerbation (HCC) -CTA chest was negative for PE but showed right middle lobe pneumonia as well as a right upper lobe pulmonary nodule and several other nonacute findings. -Right middle lobe infiltrate seen -IV Rocephin and azithromycin-- change to oral antibiotics -Scheduled and as needed duo nebs  -covid negative -Supplemental oxygen to keep sats over 90% -change duo nebs to PRN to avoid tachycardia -patient desaturate very easily. PRN Xanax for anxiety  Acute diastolic congestive heart failure -with increasing shortness of breath BNP was checked elevated to 2800 -IV Lasix 20 mg TID-- for good urine output -Echo reviewed  Paroxysmal atrial tachycardia setting of shortness of breath and anxiety -by cardiology continue metoprolol as needed  Elevated troponin -Troponin was 36>>69suspect related to demand ischemia EF 50-55%.  -Appreciate cardiology input -Patient denies chest pain and has no EKG changes  BPHwith LUTS and acute urinary retention -Continue tamsulosin --patient noted to have acute urinary retention while awaiting an inpatient bed with several  failed attempts by Er provider to place foley. Bladder scan with at time of attempt --urology rec. Maintaining foley for 1-2 days after hip surgery given difficulty with placement.  -patient currently getting IV Lasix round-the-clock-- he gets very short of breath with minimal activity. Will consider d/c Foley in 1 to 2 days once diuresis is achieved. -Foley catheter was placed by Dr. Apolinar Junes.  Pulmonary nodule -CT chest in 3 months recommended by radiologist  Rheumatoid arthritis -Continue sulfasalazine  overall multiple comorbidities with given poor lung condition overall poor prognosis palliative care has been consulted as goals of care.   DVT prophylaxis: SCD Code Status: DNR-- after discussing with palliative care Family Communication:  spoke with friend Jorene Minors (only point of contact) Disposition Plan:  patient lives at home by himself.  Barrier: snf pending. TOC working on it, patient currently not safe for discharge. He had episodes of desaturations. Oxygen requirement is 5 L nasal cannula. Hoping once respiratory status improves in next few days days he should be able to discharge. Palliative consultation  TOTAL TIME TAKING CARE OF THIS PATIENT: *30* minutes.  >50% time spent on counselling and coordination of care  Note: This dictation was prepared with Dragon dictation along with smaller phrase technology. Any transcriptional errors that result from this process are unintentional.  Enedina Finner M.D    [facility]   CC: Primary care physician; Patient, No Pcp PerPatient ID: Melanee Left, male   DOB: 09-14-1932, 86 y.o.   MRN: [id number]

## 2019-08-21 NOTE — Progress Notes (Signed)
  Subjective:  Patient reports pain as mild to moderate.    Objective:   VITALS:   Vitals:   08/21/19 0607 08/21/19 0746 08/21/19 0905 08/21/19 0910  BP:  136/71    Pulse: 93 73    Resp:  16    Temp:  98 F (36.7 C)    TempSrc:  Oral    SpO2:  99% (!) 87% 91%  Weight:      Height:        PHYSICAL EXAM:  Neurologically intact ABD soft Neurovascular intact Sensation intact distally Intact pulses distally Dorsiflexion/Plantar flexion intact Incision: scant drainage No cellulitis present Compartment soft dressing changed, ace applied  LABS  Results for orders placed or performed during the hospital encounter of 08/16/19 (from the past 24 hour(s))  Brain natriuretic peptide     Status: Abnormal   Collection Time: 08/21/19  5:42 AM  Result Value Ref Range   B Natriuretic Peptide 2,838.0 (H) 0.0 - 100.0 pg/mL  Comprehensive metabolic panel     Status: Abnormal   Collection Time: 08/21/19  5:42 AM  Result Value Ref Range   Sodium 141 135 - 145 mmol/L   Potassium 4.5 3.5 - 5.1 mmol/L   Chloride 102 98 - 111 mmol/L   CO2 32 22 - 32 mmol/L   Glucose, Bld 144 (H) 70 - 99 mg/dL   BUN 20 8 - 23 mg/dL   Creatinine, Ser 7.40 0.61 - 1.24 mg/dL   Calcium 81.4 8.9 - 48.1 mg/dL   Total Protein 5.6 (L) 6.5 - 8.1 g/dL   Albumin 2.5 (L) 3.5 - 5.0 g/dL   AST 19 15 - 41 U/L   ALT 18 0 - 44 U/L   Alkaline Phosphatase 54 38 - 126 U/L   Total Bilirubin 0.9 0.3 - 1.2 mg/dL   GFR calc non Af Amer >60 >60 mL/min   GFR calc Af Amer >60 >60 mL/min   Anion gap 7 5 - 15    DG Chest 1 View  Result Date: 08/21/2019 CLINICAL DATA:  Fracture right femur.  Tachycardia. EXAM: CHEST  1 VIEW COMPARISON:  CT chest 08/16/2019.  Chest x-ray 08/16/2019. FINDINGS: Mediastinum and hilar structures are unremarkable. Heart size stable. Bilateral pulmonary infiltrates/edema and bilateral pleural effusions noted on today's exam. CHF could present this fashion. No pneumothorax. Mild gastric distention.  Degenerative change thoracic spine and both shoulders. IMPRESSION: 1. Bilateral pulmonary infiltrates/edema bilateral pleural effusions noted on today's exam. CHF could present this fashion. 2.  Mild gastric distention. Electronically Signed   By: Maisie Fus  Register   On: 08/21/2019 05:37    Assessment/Plan: 4 Days Post-Op   Principal Problem:   Closed fracture of femur, intertrochanteric, right, initial encounter (HCC) Active Problems:   Acute on chronic respiratory failure with hypoxia and hypercapnia (HCC)   Right middle lobe pneumonia   COPD with acute exacerbation (HCC)   Essential hypertension   Fall at home, initial encounter   Closed right hip fracture (HCC)   Pulmonary nodule   Protein-calorie malnutrition, severe   Advance diet Up with therapy  Discharge per hospitalists service   Altamese Cabal , PA-C 08/21/2019, 1:46 PM

## 2019-08-21 NOTE — TOC Progression Note (Signed)
Transition of Care [facility]) - Progression Note    Patient Details  Name: Thomas Nicholson MRN: [id number] Date of Birth: [birth year]  [facility] CM/SW Contact  Barrie Dunker, RN Phone Number: 08/21/2019, 10:24 AM  Clinical Narrative:    Barrie Dunker [facility] to update the start date for the auth ref number [id number], the case manager assigned to the patient is Stacy Thurm.  She will update the dates once he admits, I notified Kelly with [facility]   Expected Discharge Plan: Skilled Nursing Facility Barriers to Discharge: Continued Medical Work up  Expected Discharge Plan and Services Expected Discharge Plan: Skilled Nursing Facility   Discharge Planning Services: CM Consult   Living arrangements for the past 2 months: Single Family Home                 DME Arranged: N/A         HH Arranged: NA           Social Determinants of Health (SDOH) Interventions    Readmission Risk Interventions No flowsheet data found.

## 2019-08-21 NOTE — Consult Note (Signed)
Consultation Note Date: 08/21/2019   Patient Name: Thomas Nicholson  DOB: [birth year]  MRN: [id number]  Age / Sex: 86 y.o., male  PCP: Patient, No Pcp Per Referring Physician: Fritzi Mandes, MD  Reason for Consultation: Establishing goals of care  HPI/Patient Profile: 86 y.o. male  with past medical history of COPD on home oxygen and RA admitted on 08/16/2019 after a fall on his R side while walking his dog. Sustained fracture of R femur. Also found to have RML pna and RUL pulmonary nodule. Patient underwent right hip trochanteric femoral nail. Patient is being treated with antibiotics for pna, he does desaturate easily. He is also receiving lasix for acute congestive heart failure. He has also had paroxysmal atrial tachycardia. PMT consulted for [place].  Clinical Assessment and Goals of Care: I have reviewed medical records including EPIC notes, labs and imaging, received report from RN and Dr. Posey Pronto, assessed the patient and then met with patient to discuss diagnosis prognosis, [place], EOL wishes, disposition and options.  RN reports patient has been refusing some interventions and had poor PO intake.  I introduced Palliative Medicine as specialized medical care for people living with serious illness. It focuses on providing relief from the symptoms and stress of a serious illness. The goal is to improve quality of life for both the patient and the family.  We discussed a brief life review of the patient. 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   Patient tells me he does have a son; however, they are estranged. He does not want his son involved in his medical care and does not want [place] to call him. Son lives in [place].  As far as functional and nutritional status, patient tells me prior to admission he was " doing okay". Independent in ADLs. Wore oxygen most of the time.    We discussed patient's  current illness and what it means in the larger context of patient's on-going co-morbidities.  Discussed complicated post-op course. He has good understanding of complications - discussed concerns about heart and lungs.   For now, patient is interested in continuing to treat the treatable and pursue rehab when he is ready for discharge. We also talked about what he would want if he were not getting better or getting worse. We discussed FULL code vs DNR. Patient tells me he would not want resuscitative interventions - agreed to code status change to DNR.  Patient shares that his good friend [name] is who he would want to make medical decisions for him - not his family.   I also called Nicole Kindred - updated him on the above conversation. Also shared Mr. Pate would want him to be his HCPOA - Nicole Kindred is agreeable. Nicole Kindred visits Mr. Adcock daily.   Questions and concerns were addressed. The patient and Nicole Kindred were encouraged to call with questions or concerns.   Primary Decision Maker PATIENT  would want friend Nicole Kindred to be HCPOA if he were unable  SUMMARY OF RECOMMENDATIONS   - code status changed to DNR - continue to treat treatable for now and PMT will follow along  Code Status/Advance Care Planning:  DNR   Symptom Management:   No pain at rest, suggest premedicating prior to movement  Prognosis:   Unable to determine  Discharge Planning: [place] for rehab with Palliative care service follow-up ?     Primary Diagnoses: Present on Admission: . Closed right hip fracture (  [place])   I have reviewed the medical record, interviewed the patient and family, and examined the patient. The following aspects are pertinent.  Past Medical History:  Diagnosis Date  . COPD (chronic obstructive pulmonary disease) ([place])   . Rheumatoid arthritis ([place])    Social History   Socioeconomic History  . Marital status: Single    Spouse name: Not on file  . Number of children: Not on  file  . Years of education: Not on file  . Highest education level: Not on file  Occupational History  . Not on file  Tobacco Use  . Smoking status: Never Smoker  . Smokeless tobacco: Never Used  Substance and Sexual Activity  . Alcohol use: Not Currently  . Drug use: Not Currently  . Sexual activity: Not on file  Other Topics Concern  . Not on file  Social History Narrative  . Not on file   Social Determinants of Health   Financial Resource Strain:   . Difficulty of Paying Living Expenses:   Food Insecurity:   . Worried About Charity fundraiser in the Last Year:   . Arboriculturist in the Last Year:   Transportation Needs:   . Film/video editor (Medical):   Marland Kitchen Lack of Transportation (Non-Medical):   Physical Activity:   . Days of Exercise per Week:   . Minutes of Exercise per Session:   Stress:   . Feeling of Stress :   Social Connections:   . Frequency of Communication with Friends and Family:   . Frequency of Social Gatherings with Friends and Family:   . Attends Religious Services:   . Active Member of Clubs or Organizations:   . Attends Archivist Meetings:   Marland Kitchen Marital Status:    History reviewed. No pertinent family history. Scheduled Meds: . bisoprolol  5 mg Oral Daily  . cefdinir  300 mg Oral Q12H  . Chlorhexidine Gluconate Cloth  6 each Topical Daily  . docusate sodium  100 mg Oral BID  . feeding supplement (ENSURE ENLIVE)  237 mL Oral TID BM  . folic acid  1 mg Oral Daily  . furosemide  40 mg Intravenous BID  . mouth rinse  15 mL Mouth Rinse BID  . mometasone-formoterol  2 puff Inhalation BID  . multivitamin with minerals  1 tablet Oral Daily  . pantoprazole  40 mg Oral Daily  . predniSONE  5 mg Oral Daily  . sulfaSALAzine  1,000 mg Oral BID  . tamsulosin  0.4 mg Oral Daily  . cyanocobalamin  1,000 mcg Oral Daily   Continuous Infusions: PRN Meds:.albuterol, bisacodyl, HYDROcodone-acetaminophen, ipratropium-albuterol, metoCLOPramide  **OR** metoCLOPramide (REGLAN) injection, metoprolol tartrate, morphine injection, ondansetron **OR** ondansetron (ZOFRAN) IV, sodium phosphate No Known Allergies Review of Systems  Constitutional: Positive for activity change and appetite change.  Respiratory: Negative for shortness of breath.   Musculoskeletal:       Hip pain with movement    Physical Exam Constitutional:      General: He is not in acute distress.    Appearance: He is ill-appearing.     Comments: Thin, frail  Cardiovascular:     Rate and Rhythm: Tachycardia present.  Pulmonary:     Effort: Pulmonary effort is normal. No respiratory distress.  Skin:    General: Skin is warm and dry.  Neurological:     Mental Status: He is alert and oriented to person, place, and time.  Psychiatric:  Mood and Affect: Mood normal.        Behavior: Behavior normal.     Vital Signs: BP 136/71 (BP Location: Left Arm)   Pulse 73   Temp 98 F (36.7 C) (Oral)   Resp 16   Ht 5' 7"  (1.702 m)   Wt 46.3 kg   SpO2 91%   BMI 15.98 kg/m  Pain Scale: 0-10 POSS *See Group Information*: 1-Acceptable,Awake and alert Pain Score: 2    SpO2: SpO2: 91 % O2 Device:SpO2: 91 % O2 Flow Rate: .O2 Flow Rate (L/min): 5 L/min  IO: Intake/output summary:   Intake/Output Summary (Last 24 hours) at 08/21/2019 1321 Last data filed at 08/21/2019 1000 Gross per 24 hour  Intake --  Output 2325 ml  Net -2325 ml    LBM: Last BM Date: 08/16/19 Baseline Weight: Weight: 46.3 kg Most recent weight: Weight: 46.3 kg     Palliative Assessment/Data: PPS 40%    Time Total: 50 minutes Greater than 50%  of this time was spent counseling and coordinating care related to the above assessment and plan.  Juel Burrow, DNP, AGNP-C Palliative Medicine Team [phone] Pager: [phone]

## 2019-08-21 NOTE — Progress Notes (Signed)
Patient was in sinus tach. Notified by tele. Patient hr was 163. Lopressor 5 mg IV was given per PRN order. Will continue to monitor.

## 2019-08-21 NOTE — Progress Notes (Signed)
OT Cancellation Note  Patient Details Name: Thomas Nicholson MRN: [id number] DOB: September 22, 1932   Cancelled Treatment:    Reason Eval/Treat Not Completed: Fatigue/lethargy limiting ability to participate;Patient declined, no reason specified. Upon attempt, pt sleeping with eyes partially open. Gentle tactile and verbal cues used to improve pt's alertness but he was unable to maintain alertness. Requesting rest at this time. O2 on 5L >95%, HR 69bpm. Provided pt with additional blanket, per his request. Will re-attempt OT tx at later date/time as pt is appropriate and able to participate.   Richrd Prime, MPH, MS, OTR/L ascom [phone] 08/21/19, 2:40 PM

## 2019-08-22 DIAGNOSIS — S72001S Fracture of unspecified part of neck of right femur, sequela: Secondary | ICD-10-CM

## 2019-08-22 DIAGNOSIS — Z515 Encounter for palliative care: Secondary | ICD-10-CM

## 2019-08-22 DIAGNOSIS — I471 Supraventricular tachycardia: Secondary | ICD-10-CM

## 2019-08-22 LAB — CULTURE, BLOOD (ROUTINE X 2)
Culture: NO GROWTH
Culture: NO GROWTH
Special Requests: ADEQUATE
Special Requests: ADEQUATE

## 2019-08-22 LAB — PREPARE RBC (CROSSMATCH)

## 2019-08-22 MED ORDER — FUROSEMIDE 40 MG PO TABS
40.0000 mg | ORAL_TABLET | Freq: Every day | ORAL | Status: DC
  Administered 2019-08-23 – 2019-08-24 (×2): 40 mg via ORAL
  Filled 2019-08-22 (×2): qty 1

## 2019-08-22 MED ORDER — DOXYCYCLINE HYCLATE 100 MG PO TABS
100.0000 mg | ORAL_TABLET | Freq: Two times a day (BID) | ORAL | Status: DC
  Administered 2019-08-22 – 2019-08-24 (×4): 100 mg via ORAL
  Filled 2019-08-22 (×5): qty 1

## 2019-08-22 NOTE — Progress Notes (Signed)
Physical Therapy Treatment Patient Details Name: Thomas Nicholson MRN: [id number] DOB: [birth year] Today's Date: 08/22/2019    History of Present Illness Thomas Nicholson is an 86yoM who comes to [facility] on 3/20 after a fall onto his Right side c subsequent Rt hip pain. Thomas Nicholson trying to open a door while walking the dog, hit head upon falling without LOC. Pt also noted to have PNA. Pt now s/p ORIF of hip, WBAT per Dr. Odis Luster. PMH: COPD on home O2 at 2 L and rheumatoid arthritis. Pt was having  more difficulty with O2 sats and sinus tachycardia on POD3, noted to also have elevated BNP.    PT Comments    Patient received in bed with SLP present agrees to PT session. Patient with slow initiation for mobility and requires mod assist with all bed mobility. Upon standing, patient found to be soiled and continued to have loose BM while standing. Patient cleaned up while standing and returned to bed. Patient will continue to benefit from skilled PT to improve strength and functional mobility.    Follow Up Recommendations  SNF;Supervision for mobility/OOB     Equipment Recommendations  Rolling walker with 5" wheels    Recommendations for Other Services       Precautions / Restrictions Precautions Precautions: Fall Restrictions Weight Bearing Restrictions: Yes RLE Weight Bearing: Weight bearing as tolerated    Mobility  Bed Mobility Overal bed mobility: Needs Assistance Bed Mobility: Supine to Sit;Sit to Supine     Supine to sit: Mod assist Sit to supine: Mod assist      Transfers Overall transfer level: Needs assistance Equipment used: Rolling walker (2 wheeled) Transfers: Sit to/from Stand Sit to Stand: Min assist;From elevated surface         General transfer comment: upon standing patient foind to hbe soiled and continued to have loose BM while standing. Cleaned up while standing then returned to supine.  Ambulation/Gait Ambulation/Gait assistance: Min assist Gait Distance  (Feet): 2 Feet Assistive device: Rolling walker (2 wheeled) Gait Pattern/deviations: Step-to pattern Gait velocity: decr   General Gait Details: deffered as patient continues to have loose BM with mobility   Stairs             Wheelchair Mobility    Modified Rankin (Stroke Patients Only)       Balance Overall balance assessment: Needs assistance Sitting-balance support: Feet supported Sitting balance-Leahy Scale: Fair     Standing balance support: Bilateral upper extremity supported;During functional activity Standing balance-Leahy Scale: Fair Standing balance comment: fatiges quickly while standing                            Cognition Arousal/Alertness: Awake/alert Behavior During Therapy: WFL for tasks assessed/performed Overall Cognitive Status: Within Functional Limits for tasks assessed Area of Impairment: Following commands;Safety/judgement;Awareness;Problem solving                   Current Attention Level: Sustained Memory: Decreased recall of precautions;Decreased short-term memory Following Commands: Follows one step commands with increased time Safety/Judgement: Decreased awareness of safety;Decreased awareness of deficits   Problem Solving: Slow processing;Decreased initiation;Requires verbal cues;Requires tactile cues        Exercises Total Joint Exercises Ankle Circles/Pumps: AAROM;10 reps;Both Quad Sets: AROM;5 reps;Right Heel Slides: AROM;10 reps;Right Straight Leg Raises: AROM;Right;10 reps    General Comments        Pertinent Vitals/Pain Pain Assessment: Faces Faces Pain Scale: Hurts a little bit Pain Location:  grimacing with Right hip ROM in bed Pain Descriptors / Indicators: Aching;Sore Pain Intervention(s): Monitored during session    Home Living                      Prior Function            PT Goals (current goals can now be found in the care plan section) Acute Rehab PT Goals Patient Stated  Goal: return to home with little dog 'Thomas Nicholson' PT Goal Formulation: With patient Time For Goal Achievement: 09/01/19 Potential to Achieve Goals: Fair Progress towards PT goals: Progressing toward goals    Frequency    BID      PT Plan Current plan remains appropriate    Co-evaluation              AM-PAC PT "6 Clicks" Mobility   Outcome Measure  Help needed turning from your back to your side while in a flat bed without using bedrails?: A Lot Help needed moving from lying on your back to sitting on the side of a flat bed without using bedrails?: A Lot Help needed moving to and from a bed to a chair (including a wheelchair)?: A Lot Help needed standing up from a chair using your arms (e.g., wheelchair or bedside chair)?: A Lot Help needed to walk in hospital room?: Total Help needed climbing 3-5 steps with a railing? : Total 6 Click Score: 10    End of Session Equipment Utilized During Treatment: Gait belt;Oxygen Activity Tolerance: Patient limited by fatigue;Other (comment)(limited by loose BM) Patient left: in bed;with bed alarm set;with call bell/phone within reach Nurse Communication: Mobility status PT Visit Diagnosis: Other abnormalities of gait and mobility (R26.89);Muscle weakness (generalized) (M62.81);History of falling (Z91.81);Difficulty in walking, not elsewhere classified (R26.2) Pain - Right/Left: Right Pain - part of body: Hip     Time: 1300-1330 PT Time Calculation (min) (ACUTE ONLY): 30 min  Charges:  $Therapeutic Exercise: 8-22 mins $Therapeutic Activity: 8-22 mins                     Thomas Nicholson, PT, GCS 08/22/19,1:48 PM

## 2019-08-22 NOTE — Progress Notes (Signed)
Occupational Therapy Treatment Patient Details Name: Thomas Nicholson MRN: [id number] DOB: [birth year] Today's Date: 08/22/2019    History of present illness Thomas Nicholson is an 86yoM who comes to [facility] on 3/20 after a fall onto his Right side c subsequent Rt hip pain. Larey Seat trying to open a door while walking the dog, hit head upon falling without LOC. Pt also noted to have PNA. Pt now s/p ORIF of hip, WBAT per Dr. Odis Luster. PMH: COPD on home O2 at 2 L and rheumatoid arthritis. Pt was having  more difficulty with O2 sats and sinus tachycardia on POD3, noted to also have elevated BNP.   OT comments  Thomas Nicholson was seen for OT treatment on this date. Upon arrival to room pt asleep and difficult to rouse. Caregiver/visitor entered room during attempt to arouse and pt awake but minimally engaged. Pt has untouched meal tray and aspiration precautions requiring assistance for meals - OT provided MAX A for self-feeding long sitting in bed, improving to SETUP for scooping ice cream as pt became increasingly engaged in meal. Pt making good progress toward goals. Pt continues to benefit from skilled OT services to maximize return to PLOF and minimize risk of future falls, injury, caregiver burden, and readmission. Will continue to follow POC. Discharge recommendation remains appropriate.    Follow Up Recommendations  SNF    Equipment Recommendations       Recommendations for Other Services      Precautions / Restrictions Precautions Precautions: Fall Restrictions Weight Bearing Restrictions: Yes RLE Weight Bearing: Weight bearing as tolerated       Mobility Bed Mobility Overal bed mobility: Needs Assistance Bed Mobility: Supine to Sit;Sit to Supine     Supine to sit: Mod assist Sit to supine: Mod assist   General bed mobility comments: MAX A scooting higher in bed and repositioning torso in bed.   Transfers Overall transfer level: Needs assistance Equipment used: Rolling walker (2  wheeled) Transfers: Sit to/from Stand Sit to Stand: Min assist;From elevated surface         General transfer comment: upon standing patient foind to hbe soiled and continued to have loose BM while standing. Cleaned up while standing then returned to supine.    Balance Overall balance assessment: Needs assistance Sitting-balance support: Feet supported Sitting balance-Leahy Scale: Fair     Standing balance support: Bilateral upper extremity supported;During functional activity Standing balance-Leahy Scale: Fair Standing balance comment: fatiges quickly while standing                           ADL either performed or assessed with clinical judgement   ADL Overall ADL's : Needs assistance/impaired                                       General ADL Comments: MAX A self-feeding at bed level until pt engaged fully in task (decreased initiation) - improving to SETUP for scooping ice cream (pt attempted but unable to scoop 2/2 weakness). SETUP for hand writing long sitting in bed - pt and caregiver requesting practice for hand writing r/t paperwork pt will be required to sign. TOTAL A don/doff B socks     Vision       Perception     Praxis      Cognition Arousal/Alertness: Lethargic;Awake/alert(Difficult to arouse at start, awake for self-feeding) Behavior During Therapy:  WFL for tasks assessed/performed;Flat affect Overall Cognitive Status: Within Functional Limits for tasks assessed Area of Impairment: Following commands;Safety/judgement;Awareness;Problem solving                   Current Attention Level: Sustained Memory: Decreased recall of precautions;Decreased short-term memory Following Commands: Follows one step commands with increased time Safety/Judgement: Decreased awareness of safety;Decreased awareness of deficits   Problem Solving: Slow processing;Decreased initiation;Requires verbal cues;Requires tactile cues           Exercises Total Joint Exercises Ankle Circles/Pumps: AAROM;10 reps;Both Quad Sets: AROM;5 reps;Right Heel Slides: AROM;10 reps;Right Straight Leg Raises: AROM;Right;10 reps Other Exercises Other Exercises: Pt and caregiver educated re: importance of eating to maintain strength, energy conservation strategies Other Exercises: Self-feeding, don/doff B socks, repositioning for meal, hand writing, bed mobility   Shoulder Instructions       General Comments SpO2 stable t/o on 5L humidified [place]    Pertinent Vitals/ Pain       Pain Assessment: No/denies pain Faces Pain Scale: Hurts a little bit Pain Location: grimacing with Right hip ROM in bed Pain Descriptors / Indicators: Aching;Sore Pain Intervention(s): Monitored during session  Home Living                                          Prior Functioning/Environment              Frequency  Min 2X/week        Progress Toward Goals  OT Goals(current goals can now be found in the care plan section)  Progress towards OT goals: Progressing toward goals  Acute Rehab OT Goals Patient Stated Goal: return to home with little dog '[place]' OT Goal Formulation: With patient Time For Goal Achievement: 09/02/19 Potential to Achieve Goals: Fair ADL Goals Pt Will Perform Grooming: sitting;with min assist(c no cues for rest breaks) Pt Will Perform Upper Body Dressing: with min assist;sitting(c no cues for rest breaks) Pt Will Transfer to Toilet: with max assist;with +2 assist;stand pivot transfer  Plan Discharge plan remains appropriate;Frequency remains appropriate    Co-evaluation                 AM-PAC OT "6 Clicks" Daily Activity     Outcome Measure   Help from another person eating meals?: A Little Help from another person taking care of personal grooming?: A Little Help from another person toileting, which includes using toliet, bedpan, or urinal?: A Lot Help from another person bathing  (including washing, rinsing, drying)?: A Lot Help from another person to put on and taking off regular upper body clothing?: A Little Help from another person to put on and taking off regular lower body clothing?: A Lot 6 Click Score: 15    End of Session Equipment Utilized During Treatment: Oxygen(5L humidified [place])  OT Visit Diagnosis: Unsteadiness on feet (R26.81);Other abnormalities of gait and mobility (R26.89);Muscle weakness (generalized) (M62.81)   Activity Tolerance Patient limited by fatigue   Patient Left in bed;with call bell/phone within reach;with bed alarm set;with family/visitor present   Nurse Communication          Time: 1517-6160 OT Time Calculation (min): 38 min  Charges: OT General Charges $OT Visit: 1 Visit OT Treatments $Self Care/Home Management : 38-52 mins  Dessie Coma, M.S. OTR/L  08/22/19, 4:05 PM

## 2019-08-22 NOTE — Progress Notes (Signed)
Subjective: 5 Days Post-Op Procedure(s) (LRB): INTRAMEDULLARY (IM) NAIL INTERTROCHANTRIC (Right) Patient reports pain as mild to moderate.   Objective: Vital signs in last 24 hours: Temp:  [97.5 F (36.4 C)-98.4 F (36.9 C)] 97.5 F (36.4 C) (03/26 0751) Pulse Rate:  [61-91] 81 (03/26 0751) Resp:  [17-20] 18 (03/26 0751) BP: (118-157)/(67-81) 146/81 (03/26 0751) SpO2:  [96 %-99 %] 99 % (03/26 0751)  Intake/Output from previous day: 03/25 0701 - 03/26 0700 In: 237 [P.O.:237] Out: 3000 [Urine:3000] Intake/Output this shift: No intake/output data recorded.  Recent Labs    08/20/19 0519  HGB 11.4*   Recent Labs    08/20/19 0519  WBC 9.0  RBC 3.69*  HCT 36.4*  PLT 96*   Recent Labs    08/21/19 0542  NA 141  K 4.5  CL 102  CO2 32  BUN 20  CREATININE 0.67  GLUCOSE 144*  CALCIUM 10.0   No results for input(s): LABPT, INR in the last 72 hours.  Neurologically intact ABD soft Neurovascular intact Sensation intact distally Intact pulses distally Dorsiflexion/Plantar flexion intact Incision: yellow drainage but no surrounding erythema No cellulitis present Compartment soft Dressing was changed today    Assessment/Plan: 5 Days Post-Op Procedure(s) (LRB): INTRAMEDULLARY (IM) NAIL INTERTROCHANTRIC (Right)   Principal Problem:   Closed fracture of femur, intertrochanteric, right, initial encounter [facility]) Active Problems:   Acute on chronic respiratory failure with hypoxia and hypercapnia (HCC)   Right middle lobe pneumonia   COPD with acute exacerbation (HCC)   Essential hypertension   Fall at home, initial encounter   Closed right hip fracture (HCC)   Pulmonary nodule   Protein-calorie malnutrition, severe   Advance diet Up with therapy Discharge per hospitalist service.      Patience Musca 08/22/2019, 12:16 PM

## 2019-08-22 NOTE — Progress Notes (Signed)
D: Pt alert and oriented x 4 with much prompting. Pt answers were delayed. Pt would appear to "zone out". Swot RN and MD were witness to pt state. Speech consult was placed. Once evaluated pt diet made DYS. Pt takes meds crushed in apple sauce. Pt denies experiencing any pain at this time. Pt appears to be more alert as the evening progresses.  A: Scheduled medications administered to pt, per MD orders. Support and encouragement provided. Frequent verbal contact made.   R: No adverse drug reactions noted. Pt complaint with medications and treatment plan. Pt interacts well with staff on the unit. Pt is stable at this time, Will continue to monitor and provide care for as ordered.

## 2019-08-22 NOTE — Progress Notes (Signed)
Ch visited with Pt in response to OR for MPOA. Palliative Care team's nurse practitioner also stated that Pt was wanting to add his friend as MPOA, and not any of his family members. Upon arrival, Pt was not very coherent. When Ch asked about his wish to list his friend in his MPOA, Pt nodded. But Pt is not in a position to complete any document at this time. Pt's alertness level is very low. Ch informed RNs about it, and left.

## 2019-08-22 NOTE — Progress Notes (Signed)
Progress Note  Patient Name: Thomas Nicholson Date of Encounter: 08/22/2019  Primary Cardiologist: Julien Nordmann, MD  Subjective   "I hurt all over."  Denies dyspnea.  Groggy - falls asleep during interview.  Inpatient Medications    Scheduled Meds:  bisoprolol  5 mg Oral Daily   cefdinir  300 mg Oral Q12H   Chlorhexidine Gluconate Cloth  6 each Topical Daily   docusate sodium  100 mg Oral BID   doxycycline  100 mg Oral Q12H   feeding supplement (ENSURE ENLIVE)  237 mL Oral TID BM   folic acid  1 mg Oral Daily   [START ON 08/23/2019] furosemide  40 mg Oral Daily   mouth rinse  15 mL Mouth Rinse BID   mometasone-formoterol  2 puff Inhalation BID   multivitamin with minerals  1 tablet Oral Daily   pantoprazole  40 mg Oral Daily   predniSONE  5 mg Oral Daily   sulfaSALAzine  1,000 mg Oral BID   tamsulosin  0.4 mg Oral Daily   cyanocobalamin  1,000 mcg Oral Daily   Continuous Infusions:  PRN Meds: albuterol, ALPRAZolam, bisacodyl, HYDROcodone-acetaminophen, ipratropium-albuterol, metoCLOPramide **OR** metoCLOPramide (REGLAN) injection, metoprolol tartrate, morphine injection, ondansetron **OR** ondansetron (ZOFRAN) IV, sodium phosphate   Vital Signs    Vitals:   08/21/19 0910 08/21/19 1538 08/22/19 0035 08/22/19 0751  BP:  (!) 157/76 118/67 (!) 146/81  Pulse:  91 61 81  Resp:  17 20 18   Temp:  98 F (36.7 C) 98.4 F (36.9 C) (!) 97.5 F (36.4 C)  TempSrc:  Oral  Oral  SpO2: 91% 99% 96% 99%  Weight:      Height:        Intake/Output Summary (Last 24 hours) at 08/22/2019 1141 Last data filed at 08/22/2019 08/24/2019 Gross per 24 hour  Intake 237 ml  Output 1600 ml  Net -1363 ml   Filed Weights   08/16/19 1912  Weight: 46.3 kg    Physical Exam   GEN: frail, in no acute distress.  HEENT: Grossly normal.  Neck: Supple, no JVD, carotid bruits, or masses. Cardiac: RRR, no murmurs, rubs, or gallops. No clubbing, cyanosis, edema.  Radials/DP/PT 1+  and equal bilaterally.  Respiratory:  Respirations regular and unlabored, diminished breath sounds bilat. GI: Soft, nontender, nondistended, BS + x 4. MS: no deformity or atrophy. Skin: warm and dry, no rash. Neuro:  Strength and sensation are intact. Psych: AAOx3.  Normal affect.  Labs    Chemistry Recent Labs  Lab 08/16/19 1931 08/18/19 0346 08/21/19 0542  NA 140 145 141  K 4.4 4.1 4.5  CL 98 109 102  CO2 33* 31 32  GLUCOSE 176* 171* 144*  BUN 22 17 20   CREATININE 1.25* 0.81 0.67  CALCIUM 9.4 8.1* 10.0  PROT 6.4*  --  5.6*  ALBUMIN 3.1*  --  2.5*  AST 19  --  19  ALT 14  --  18  ALKPHOS 83  --  54  BILITOT 0.6  --  0.9  GFRNONAA 53* >60 >60  GFRAA >60 >60 >60  ANIONGAP 9 5 7      Hematology Recent Labs  Lab 08/17/19 0742 08/18/19 0346 08/20/19 0519  WBC 11.4* 12.8* 9.0  RBC 2.81* 3.65* 3.69*  HGB 9.2* 11.5* 11.4*  HCT 30.8* 35.1* 36.4*  MCV 109.6* 96.2 98.6  MCH 32.7 31.5 30.9  MCHC 29.9* 32.8 31.3  RDW 11.9 16.7* 16.0*  PLT 154 96* 96*  Cardiac Enzymes  Recent Labs  Lab 08/16/19 1931 08/16/19 2222  TROPONINIHS 36* 69*      BNP Recent Labs  Lab 08/21/19 0542  BNP 2,838.0*     Radiology    DG Chest 1 View  Result Date: 08/21/2019 CLINICAL DATA:  Fracture right femur.  Tachycardia. EXAM: CHEST  1 VIEW COMPARISON:  CT chest 08/16/2019.  Chest x-ray 08/16/2019. FINDINGS: Mediastinum and hilar structures are unremarkable. Heart size stable. Bilateral pulmonary infiltrates/edema and bilateral pleural effusions noted on today's exam. CHF could present this fashion. No pneumothorax. Mild gastric distention. Degenerative change thoracic spine and both shoulders. IMPRESSION: 1. Bilateral pulmonary infiltrates/edema bilateral pleural effusions noted on today's exam. CHF could present this fashion. 2.  Mild gastric distention. Electronically Signed   By: Marcello Moores  Register   On: 08/21/2019 05:37    Telemetry    Sinus rhythm/sinus tachy, freq PACs. No  further sustained atrial tachycardia - Personally Reviewed  Cardiac Studies   2D Echocardiogram 3.21.2021  1. Left ventricular ejection fraction, by estimation, is 50 to 55%. The  left ventricle has low normal function. The left ventricle has no regional  wall motion abnormalities. Left ventricular diastolic parameters are  consistent with Grade I diastolic  dysfunction (impaired relaxation).  2. Right ventricular systolic function is normal. The right ventricular  size is normal. There is moderately elevated pulmonary artery systolic  pressure.  3. Borderline to mild aortic valve stenosis, estimated AVA 1.5 -1.6 cm  sq, mean gradient 11 mm Hg  4. Frequent ectopy noted   Patient Profile     86 y.o. male w/ a h/o COPD on home O2 and rheumatoid arthritis, who was admitted 08/16/2019 following fall and R prox femur fx s/p IM Nail on 3/21.  Post-op tx for RML PNA  Assessment & Plan    1.  R Femur Fx:  S/p IM Nail.  Per ortho.  2.  PAT:  Pt w/ sinus rhythm/sinus tach and freq PACs @ baseline.  He has had several runs of atrial tach w/ rates into the170's - most recent occurred while we were seeing him on 3/25, after receiving an enema and while he was straining to have BM.  We added  blocker yesterday and PAT broke and has not recurred.  No evidence of high grade heart block (a concern in the setting of baseline bifascicular block).  Cont bisoprolol @ current dose.  3.  Acute diastolic CHF:  CXR 2/54 w/ bilat pulm infiltrates/edema and bilat pl effusions.  BNP was 2838. He was diuresed yesterday w/ good response  minus 2.7L.  Denies dyspnea this AM.  Euvolemic on exam.  Agree w/ transition to oral lasix.  4.  RML PNA:  abx per IM.  5.  COPD:  Steroids/nebs per IM.  Signed, Murray Hodgkins, NP  08/22/2019, 11:41 AM    For questions or updates, please contact   Please consult [email or web address] for contact info under Cardiology/STEMI.

## 2019-08-22 NOTE — Care Management Important Message (Signed)
Important Message  Patient Details  Name: Thomas Nicholson MRN: [id number] Date of Birth: 06/25/1932   Medicare Important Message Given:  Yes     Olegario Messier A Saylor Sheckler 08/22/2019, 11:24 AM

## 2019-08-22 NOTE — Progress Notes (Signed)
Physical Therapy Treatment Patient Details Name: Thomas Nicholson MRN: [id number] DOB: 02/24/1933 Today's Date: 08/22/2019    History of Present Illness Alonza Knisley is an 47yoM who comes to [facility] on 3/[facility] after a fall onto his Right side c subsequent Rt hip pain. [name] trying to open a door while walking the dog, hit head upon falling without LOC. Pt also noted to have PNA. Pt now s/p ORIF of hip, WBAT per Dr. Harlow Mares. PMH: COPD on home O2 at 2 L and rheumatoid arthritis. Pt was having  more difficulty with O2 sats and sinus tachycardia on POD3, noted to also have elevated BNP.    PT Comments    Patient received in bed, lethargic appearing. Agrees to PT session. Patient is slow to respond, move. Requires mod assist with bed mobility and increased time. Patient stood with mod assist from elevated surface with RW, was able to take a few steps, but due to having loose stool, he was assisted back to bed for cleaning. Patient will continue to benefit from skilled PT to improve functional mobility and strength.      Follow Up Recommendations  SNF;Supervision for mobility/OOB     Equipment Recommendations  Rolling walker with 5" wheels    Recommendations for Other Services       Precautions / Restrictions Precautions Precautions: Fall Restrictions Weight Bearing Restrictions: Yes RLE Weight Bearing: Weight bearing as tolerated    Mobility  Bed Mobility   Bed Mobility: Supine to Sit;Sit to Supine     Supine to sit: Mod assist Sit to supine: Mod assist      Transfers Overall transfer level: Needs assistance Equipment used: Rolling walker (2 wheeled) Transfers: Sit to/from Stand Sit to Stand: Min assist;From elevated surface         General transfer comment: patient noted to have soiled pad under him upon standing and proceeded to have loose stool on floor once standing.  Ambulation/Gait Ambulation/Gait assistance: Min assist Gait Distance (Feet): 2 Feet Assistive  device: Rolling walker (2 wheeled) Gait Pattern/deviations: Step-to pattern Gait velocity: decr   General Gait Details: patient attempted to get over to recliner but due to needing to be cleaned up was assisted back to bed.   Stairs             Wheelchair Mobility    Modified Rankin (Stroke Patients Only)       Balance Overall balance assessment: Needs assistance Sitting-balance support: Feet supported Sitting balance-Leahy Scale: Fair     Standing balance support: Bilateral upper extremity supported;During functional activity Standing balance-Leahy Scale: Fair Standing balance comment: difficulty stepping while standing                            Cognition Arousal/Alertness: Lethargic Behavior During Therapy: WFL for tasks assessed/performed Overall Cognitive Status: Within Functional Limits for tasks assessed                         Following Commands: Follows one step commands consistently Safety/Judgement: Decreased awareness of safety   Problem Solving: Slow processing;Decreased initiation;Requires verbal cues;Requires tactile cues        Exercises Total Joint Exercises Ankle Circles/Pumps: AROM;5 reps;Both Heel Slides: AROM;5 reps;Both Straight Leg Raises: AROM;5 reps;Both    General Comments        Pertinent Vitals/Pain Pain Assessment: Faces Faces Pain Scale: Hurts a little bit Pain Location: grimacing with Right hip ROM in bed  Pain Descriptors / Indicators: Aching;Sore Pain Intervention(s): Monitored during session    Home Living                      Prior Function            PT Goals (current goals can now be found in the care plan section) Acute Rehab PT Goals Patient Stated Goal: return to home with little dog '[place]' PT Goal Formulation: With patient Time For Goal Achievement: 09/01/19 Potential to Achieve Goals: Fair Progress towards PT goals: Progressing toward goals    Frequency     BID      PT Plan Current plan remains appropriate    Co-evaluation              AM-PAC PT "6 Clicks" Mobility   Outcome Measure  Help needed turning from your back to your side while in a flat bed without using bedrails?: A Lot Help needed moving from lying on your back to sitting on the side of a flat bed without using bedrails?: A Lot Help needed moving to and from a bed to a chair (including a wheelchair)?: A Lot Help needed standing up from a chair using your arms (e.g., wheelchair or bedside chair)?: A Lot Help needed to walk in hospital room?: Total Help needed climbing 3-5 steps with a railing? : Total 6 Click Score: 10    End of Session Equipment Utilized During Treatment: Gait belt;Oxygen Activity Tolerance: Patient limited by fatigue Patient left: in bed;with nursing/sitter in room Nurse Communication: Mobility status PT Visit Diagnosis: Difficulty in walking, not elsewhere classified (R26.2);Other abnormalities of gait and mobility (R26.89);Unsteadiness on feet (R26.81);Muscle weakness (generalized) (M62.81);Pain;History of falling (Z91.81) Pain - Right/Left: Right Pain - part of body: Hip     Time: 2633-3545 PT Time Calculation (min) (ACUTE ONLY): 31 min  Charges:  $Therapeutic Exercise: 8-22 mins $Therapeutic Activity: 8-22 mins                     Malachi Kinzler, PT, GCS 08/22/19,12:13 PM

## 2019-08-22 NOTE — Evaluation (Signed)
Clinical/Bedside Swallow Evaluation Patient Details  Name: Thomas Nicholson MRN: [id number] Date of Birth: 27-Apr-1933  Today's Date: 08/22/2019 Time: SLP Start Time (ACUTE ONLY): 23 SLP Stop Time (ACUTE ONLY): 1325 SLP Time Calculation (min) (ACUTE ONLY): 55 min  Past Medical History:  Past Medical History:  Diagnosis Date  . COPD (chronic obstructive pulmonary disease) ([place])   . Rheumatoid arthritis [facility])    Past Surgical History:  Past Surgical History:  Procedure Laterality Date  . INTRAMEDULLARY (IM) NAIL INTERTROCHANTERIC Right 08/17/2019   Procedure: INTRAMEDULLARY (IM) NAIL INTERTROCHANTRIC;  Surgeon: Lovell Sheehan, MD;  Location: [facility] ORS;  Service: Orthopedics;  Laterality: Right;   HPI:  Pt is a 86 y.o. male hx of COPD on 2 L [place] at baseline, hypertension, rheumatoid arthritis, and weight loss here presenting with fall with right hip pain.  Patient states that he was walking his dog and tripped and fell onto the right hip.  He also did hit his head on the door but did not lose consciousness.  Patient was noted to be hypoxic and tachycardic on arrival.  Patient states that he is wearing oxygen and denies any worsening shortness of breath.  Patient was noted to have right hip deformity at admit.  Pt is Post-Op Procedure(s) (LRB): INTRAMEDULLARY (IM) NAIL INTERTROCHANTRIC (Right).  CXR: "Bilateral pulmonary infiltrates/edema bilateral pleural effusions; CHF; emphysema".  Previous Head CT: "Small vessel ischemic disease and parenchymal volume loss".   Assessment / Plan / Recommendation Clinical Impression  Pt appears to present w/ oropharyngeal phase dysphagia primarily impacted by declined Cognitive status/attention w/ po tasks, and generalized weakness overall during session. He is at increased risk for aspiration w/ oral intake, especially thin liquids currently. Any decreased attention/awareness and weakness of status during swallowing tasks can increase risk for aspiration,  dysphagia thus Pulmonary impact/decline. Pt given full support to sit upright for po trials; support w/ feeding including holding Cup during po trials -- mostly fed TSP trials of the Nectar liquids. Pt consumed trials of ice chips, Nectar liquids via tsp/cup and purees w/ no immediate, overt coughing noted; no decline in vocal quality or respiratory status during/post po's. Pt exhibited increased oral awareness w/ the Nectar consistency liquids thus improved bolus control and management. Oral phase c/b grossly adequate bolus management and time w/ po trials given; A-P transfer for swallowing and oral clearing were adequate. No anterior spillage noted. OM exam appeared wfl for lingual movements/strength; no unilateral weakness noted. Pt required min-mod verbal cues for follow through w/ tasks. Recommend initiation of oral diet of Dysphagia level 1 (puree) w/ Nectar liquids via TSP/Cup; aspiration precautions; Pills Crushed in puree for easier, safer swallowing. Feeding support at meals. ST services will f/u w/ toleration of diet and trials to upgrade diet as able next 1-2 days.  SLP Visit Diagnosis: Dysphagia, oropharyngeal phase (R13.12)    Aspiration Risk  Moderate aspiration risk;Risk for inadequate nutrition/hydration    Diet Recommendation  Dysphagia level 1 (puree) w/ Nectar liquids via TSP/Cup; aspiration precautions; feeding support and supervision at all meals  Medication Administration: Crushed with puree(for safer swallowing)    Other  Recommendations Recommended Consults: (Dietician f/u for support) Oral Care Recommendations: Oral care BID;Oral care before and after PO;Staff/trained caregiver to provide oral care Other Recommendations: Order thickener from pharmacy;Prohibited food (jello, ice cream, thin soups);Remove water pitcher;Have oral suction available   Follow up Recommendations Skilled Nursing facility(TBD)      Frequency and Duration min 3x week  2 weeks  Prognosis  Prognosis for Safe Diet Advancement: Fair Barriers to Reach Goals: (baseline deconditioning; O2 needs)      Swallow Study   General Date of Onset: 08/16/19 HPI: Pt is a 86 y.o. male hx of COPD on 2 L [place] at baseline, hypertension, rheumatoid arthritis, and weight loss here presenting with fall with right hip pain.  Patient states that he was walking his dog and tripped and fell onto the right hip.  He also did hit his head on the door but did not lose consciousness.  Patient was noted to be hypoxic and tachycardic on arrival.  Patient states that he is wearing oxygen and denies any worsening shortness of breath.  Patient was noted to have right hip deformity at admit.  Pt is Post-Op Procedure(s) (LRB): INTRAMEDULLARY (IM) NAIL INTERTROCHANTRIC (Right).  CXR: "Bilateral pulmonary infiltrates/edema bilateral pleural effusions; CHF; emphysema".  Previous Head CT: "Small vessel ischemic disease and parenchymal volume loss". Type of Study: Bedside Swallow Evaluation Previous Swallow Assessment: none Diet Prior to this Study: NPO(made NPO this morning; regular diet prior) Temperature Spikes Noted: No(wbc 9.0) Respiratory Status: Nasal cannula(5L) History of Recent Intubation: No Behavior/Cognition: Alert;Cooperative;Pleasant mood;Distractible;Requires cueing(unsure of pt's baseline Cognitive status) Oral Cavity Assessment: Dry(sticky) Oral Care Completed by SLP: Yes Oral Cavity - Dentition: Missing dentition(no upper denture plate in; 2 lower teeth noted) Vision: Functional for self-feeding Self-Feeding Abilities: Able to feed self;Needs assist;Needs set up;Total assist(too weak) Patient Positioning: Upright in bed(needed full positioning upright in bed) Volitional Cough: Weak Volitional Swallow: Able to elicit    Oral/Motor/Sensory Function Overall Oral Motor/Sensory Function: Within functional limits(adequate - no unilateral weakness)   Ice Chips Ice chips: Within functional limits Presentation:  Spoon(fed; 4 trials)   Thin Liquid Thin Liquid: Not tested    Nectar Thick Nectar Thick Liquid: Within functional limits Presentation: Cup;Self Fed;Spoon(~3 ozs)   Honey Thick Honey Thick Liquid: Not tested   Puree Puree: Within functional limits Presentation: Spoon(fed; ~3 ozs)   Solid     Solid: Not tested       Jerilynn Som, MS, CCC-SLP Andre Swander 08/22/2019,1:49 PM

## 2019-08-22 NOTE — Progress Notes (Addendum)
Daily Progress Note   Patient Name: Thomas Nicholson       Date: 08/22/2019 DOB: [birth year]  Age: 86 y.o. MRN#: [id number] Attending Physician: Fritzi Mandes, MD Primary Care Physician: Patient, No Pcp Per Admit Date: 08/16/2019  Reason for Consultation/Follow-up: Establishing goals of care  Subjective: Patient reports feeling better than yesterday, ready to go to rehab - possible transfer over the weekend  Length of Stay: 6  Current Medications: Scheduled Meds:  . bisoprolol  5 mg Oral Daily  . cefdinir  300 mg Oral Q12H  . Chlorhexidine Gluconate Cloth  6 each Topical Daily  . docusate sodium  100 mg Oral BID  . feeding supplement (ENSURE ENLIVE)  237 mL Oral TID BM  . folic acid  1 mg Oral Daily  . furosemide  40 mg Intravenous BID  . mouth rinse  15 mL Mouth Rinse BID  . mometasone-formoterol  2 puff Inhalation BID  . multivitamin with minerals  1 tablet Oral Daily  . pantoprazole  40 mg Oral Daily  . predniSONE  5 mg Oral Daily  . sulfaSALAzine  1,000 mg Oral BID  . tamsulosin  0.4 mg Oral Daily  . cyanocobalamin  1,000 mcg Oral Daily    Continuous Infusions:   PRN Meds: albuterol, ALPRAZolam, bisacodyl, HYDROcodone-acetaminophen, ipratropium-albuterol, metoCLOPramide **OR** metoCLOPramide (REGLAN) injection, metoprolol tartrate, morphine injection, ondansetron **OR** ondansetron (ZOFRAN) IV, sodium phosphate  Physical Exam Constitutional:      General: He is not in acute distress.    Comments: Frail, thin  Pulmonary:     Effort: Pulmonary effort is normal. No respiratory distress.  Skin:    General: Skin is warm and dry.  Neurological:     Mental Status: He is alert and oriented to person, place, and time.  Psychiatric:        Mood and Affect: Mood normal.        Behavior:  Behavior normal.             Vital Signs: BP (!) 146/81 (BP Location: Left Arm)   Pulse 81   Temp (!) 97.5 F (36.4 C) (Oral)   Resp 18   Ht 5\' 7"  (1.702 m)   Wt 46.3 kg   SpO2 99%   BMI 15.98 kg/m  SpO2: SpO2: 99 % O2 Device: O2 Device: Nasal Cannula O2 Flow Rate: O2 Flow Rate (L/min): 5 L/min  Intake/output summary:   Intake/Output Summary (Last 24 hours) at 08/22/2019 1033 Last data filed at 08/22/2019 7209 Gross per 24 hour  Intake 237 ml  Output 1600 ml  Net -1363 ml   LBM: Last BM Date: 08/22/19(Large soft BM) Baseline Weight: Weight: 46.3 kg Most recent weight: Weight: 46.3 kg       Palliative Assessment/Data: PPS 40%    Flowsheet Rows     Most Recent Value  Intake Tab  Referral Department  Hospitalist  Unit at Time of Referral  Med/Surg Unit  Palliative Care Primary Diagnosis  Trauma  Date Notified  08/21/19  Palliative Care Type  New Palliative care  Reason for referral  Clarify Goals of Care  Date of Admission  08/16/19  Date first seen by Palliative Care  08/21/19  #  of days Palliative referral response time  0 Day(s)  # of days IP prior to Palliative referral  5  Clinical Assessment  Palliative Performance Scale Score  40%  Psychosocial & Spiritual Assessment  Palliative Care Outcomes  Patient/Family meeting held?  Yes  Who was at the meeting?  patient  Palliative Care Outcomes  Clarified goals of care, Provided psychosocial or spiritual support, Changed CPR status      Patient Active Problem List   Diagnosis Date Noted  . Palliative care by specialist 08/21/2019  . Paroxysmal tachycardia (HCC) 08/21/2019  . Tachycardia   . Goals of care, counseling/discussion   . DNR (do not resuscitate)   . Protein-calorie malnutrition, severe 08/18/2019  . Closed fracture of femur, intertrochanteric, right, initial encounter (HCC) 08/16/2019  . Acute on chronic respiratory failure with hypoxia and hypercapnia (HCC) 08/16/2019  . Right middle lobe  pneumonia 08/16/2019  . COPD with acute exacerbation (HCC) 08/16/2019  . Essential hypertension 08/16/2019  . Fall at home, initial encounter 08/16/2019  . Closed right hip fracture (HCC) 08/16/2019  . Pulmonary nodule 08/16/2019    Palliative Care Assessment & Plan   HPI:  86 y.o. male  with past medical history of COPD on home oxygen and RA admitted on 08/16/2019 after a fall on his R side while walking his dog. Sustained fracture of R femur. Also found to have RML pna and RUL pulmonary nodule. Patient underwent right hip trochanteric femoral nail. Patient is being treated with antibiotics for pna, he does desaturate easily. He is also receiving lasix for acute congestive heart failure. He has also had paroxysmal atrial tachycardia. PMT consulted for GOC.  Assessment: Thomas Nicholson reports feeling better today - hopeful to go to rehab over the weekend.  We discussed HCPOA documentation as he expressed interest yesterday for his friend, Thomas Nicholson to be HCPOA instead of any family - discussed with chaplain.  Recommendations/Plan:  To complete HCPOA documentation today  Maintain DNR status PMT will shadow chart for decline Palliative to see outpatient  Code Status:  DNR  Prognosis:   Unable to determine  Discharge Planning:  Skilled Nursing Facility for rehab with Palliative care service follow-up  Care plan was discussed with Dr. Allena Katz, patient, chaplain, [facility]  Thank you for allowing the Palliative Medicine Team to assist in the care of this patient.   Total Time 15 minutes Prolonged Time Billed  no       Greater than 50%  of this time was spent counseling and coordinating care related to the above assessment and plan.  Gerlean Ren, DNP, [facility] Palliative Medicine Team Team Phone # [phone]  Pager [phone]

## 2019-08-22 NOTE — Progress Notes (Signed)
[facility] [place] NAME: Thomas Nicholson    MR#:  [id number]  DATE OF BIRTH:  08-30-1932  SUBJECTIVE:   Patient appears frail and weak very fatigued and deconditioning patient had a large bowel movement this morning. Fatigued and tired after the cleanup process. Per RN Serosanginous discharge from right hip surgical incision--no  Fever Patient uses oxygen chronically 2-2 1/2 L at home. Currently he is on 5 L.  Lucile Shutters in the room REVIEW OF SYSTEMS:   Review of Systems  Constitutional: Negative for chills, fever and weight loss.  HENT: Negative for ear discharge, ear pain and nosebleeds.   Eyes: Negative for blurred vision, pain and discharge.  Respiratory: Positive for shortness of breath. Negative for sputum production, wheezing and stridor.   Cardiovascular: Positive for orthopnea and PND. Negative for chest pain and palpitations.  Gastrointestinal: Negative for abdominal pain, diarrhea, nausea and vomiting.  Genitourinary: Negative for frequency and urgency.  Musculoskeletal: Negative for back pain and joint pain.  Neurological: Positive for weakness. Negative for sensory change, speech change and focal weakness.  Psychiatric/Behavioral: Negative for depression and hallucinations. The patient is nervous/anxious.    Tolerating Diet:yes Tolerating PT: rehab  DRUG ALLERGIES:  No Known Allergies  VITALS:  Blood pressure (!) 146/81, pulse 81, temperature (!) 97.5 F (36.4 C), temperature source Oral, resp. rate 18, height 5\' 7"  (1.702 m), weight 46.3 kg, SpO2 99 %.  PHYSICAL EXAMINATION:   Physical Exam  GENERAL:  86 y.o.-year-old patient lying in the bed with all to moderate respiratory acute distress. Thin cachectic chronically ill deconditioned EYES: Pupils equal, round, reactive to light and accommodation. No scleral icterus.   HEENT: Head atraumatic, normocephalic. Oropharynx and nasopharynx clear.  NECK:   Supple, no jugular venous distention. No thyroid enlargement, no tenderness.  LUNGS: decreased and shallow breath sounds bilaterally, no wheezing, rales, rhonchi. No use of accessory muscles of respiration. emphysematous chest, purse lip breathing. CARDIOVASCULAR: S1, S2 normal. No murmurs, rubs, or gallops. Tachycardia + ABDOMEN: Soft, nontender, nondistended. Bowel sounds present. No organomegaly or mass.  EXTREMITIES: No cyanosis, clubbing or edema b/l.   Right hip surgical dressing-- serosangiunous discharge NEUROLOGIC: very weak and deconditioned. Grossly nonfocal.  PSYCHIATRIC:  patient is alert and oriented x 2.  SKIN: No obvious rash, lesion, or ulcer.per RN  LABORATORY PANEL:  CBC Recent Labs  Lab 08/20/19 0519  WBC 9.0  HGB 11.4*  HCT 36.4*  PLT 96*    Chemistries  Recent Labs  Lab 08/21/19 0542  NA 141  K 4.5  CL 102  CO2 32  GLUCOSE 144*  BUN 20  CREATININE 0.67  CALCIUM 10.0  AST 19  ALT 18  ALKPHOS 54  BILITOT 0.9   Cardiac Enzymes No results for input(s): TROPONINI in the last 168 hours. RADIOLOGY:  DG Chest 1 View  Result Date: 08/21/2019 CLINICAL DATA:  Fracture right femur.  Tachycardia. EXAM: CHEST  1 VIEW COMPARISON:  CT chest 08/16/2019.  Chest x-ray 08/16/2019. FINDINGS: Mediastinum and hilar structures are unremarkable. Heart size stable. Bilateral pulmonary infiltrates/edema and bilateral pleural effusions noted on today's exam. CHF could present this fashion. No pneumothorax. Mild gastric distention. Degenerative change thoracic spine and both shoulders. IMPRESSION: 1. Bilateral pulmonary infiltrates/edema bilateral pleural effusions noted on today's exam. CHF could present this fashion. 2.  Mild gastric distention. Electronically Signed   By: Marcello Moores  Register   On: 08/21/2019 05:37   ASSESSMENT AND PLAN:  Herbie Baltimore  L Fergusonis a 86 y.o.malewith medical history significant forCOPD on home O2 at 2 L and rheumatoid arthritis brought in following  fall onto his right side while walking his dog. He hit his head on the door but did not lose consciousness. Because of tachycardia and tachypnea he had a CTA chest that was negative for PE but showed right middle lobe pneumonia as well as a right upper lobe pulmonary nodule and several other nonacute findings.  Closed fracture of femur, intertrochanteric, right, initial encounter (HCC) Accidental fall at home, initial encounter -s/p  right hip trochanteric femoral nail. -Pain control -S/p arterial ligation of Profunda branch by Vascular surgery Dr Myra Gianotti post op - received prbc transfusion. hemodynamic stable.  -hgb stable -patient did receive one unit blood transfusion. -Patient has right hip incision site serosanginous discharge+ no cellulitis -no fever, wbc 9.0 -discussed with Dr. Odis Luster on the phone. Recommends doxycycline for seven days.  Acute on chronic respiratory failure with hypoxia and hypercapnia (HCC) Right middle lobe pneumonia/community-acquired versus aspiration with mucus plugging noted on CT chest COPD with acute exacerbation (HCC) -CTA chest was negative for PE but showed right middle lobe pneumonia as well as a right upper lobe pulmonary nodule and several other nonacute findings. -Right middle lobe infiltrate seen -IV Rocephin and azithromycin-- change to oral antibiotics (last dose today) -Scheduled and as needed duo nebs  -covid negative -Supplemental oxygen to keep sats over 90% -change duo nebs to PRN to avoid tachycardia -patient desaturate very easily. PRN Xanax for anxiety  Acute diastolic congestive heart failure -with increasing shortness of breath BNP was checked elevated to 2800 -IV Lasix 20 mg TID-- for good urine output--change to po lasix -Echo reviewed  Paroxysmal atrial tachycardia setting of shortness of breath and anxiety -by cardiology continue metoprolol as needed  Elevated troponin -Troponin was 36>>69suspect related to demand  ischemia EF 50-55%.  -Appreciate cardiology input -Patient denies chest pain and has no EKG changes  BPHwith LUTS and acute urinary retention s/p foley placement -Continue tamsulosin --patient noted to have acute urinary retention while awaiting an inpatient bed with several failed attempts by Er provider to place foley. --urology rec. Maintaining foley for 1-2 days after hip surgery given difficulty with placement.  -patient currently getting IV Lasix round-the-clock-- he gets very short of breath with minimal activity.will d/c foley today since lasix changed to 40 mg po qd -Foley catheter was placed by Dr. Apolinar Junes on 3/21  Pulmonary nodule -CT chest in 3 months recommended by radiologist  Rheumatoid arthritis -Continue sulfasalazine  overall multiple comorbidities with given poor lung condition overall poor prognosis palliative care consulted and appreciate input. Patient is at a high risk for continued decline and cardiorespiratory arrest.  Discussed with patient's friend Jorene Minors --he understands patient has a poor prognosis.   DVT prophylaxis: SCD-- holding due ot increase incision drainage per Dr Odis Luster rec Code Status: DNR-- after discussing with palliative care on 08/22/2019 Family Communication:  spoke with friend Jorene Minors (only point of contact) Disposition Plan:  patient lives at home by himself.  Barrier: snf pending. TOC working on it, patient currently not safe for discharge. He is having freq episodes of episodes of desaturations. Oxygen requirement is 5 L nasal cannula. Hoping once respiratory status improves in next few days days he should be able to discharge. Palliative consultation done  TOTAL TIME TAKING CARE OF THIS PATIENT: *30* minutes.  >50% time spent on counselling and coordination of care  Note: This dictation was prepared  with Dragon dictation along with smaller phrase technology. Any transcriptional errors that result from this process are  unintentional.  Enedina Finner M.D    [facility]   CC: Primary care physician; Patient, No Pcp PerPatient ID: Thomas Nicholson, male   DOB: Jun 24, 1932, 86 y.o.   MRN: [id number]

## 2019-08-23 NOTE — Progress Notes (Signed)
PT Cancellation Note  Patient Details Name: ALYAS CREARY MRN: [id number] DOB: 1933/05/12   Cancelled Treatment:     PT attempt. Pt refused. Pt was asleep upon arriving and easily awakes however is unwilling to participate. " I've been busy all day and I just want to rest." Max encouragement but pt remained unwilling. Acute PT will continue to follow per POC and progress as able per pt tolerance.    Rushie Chestnut 08/23/2019, 2:24 PM

## 2019-08-23 NOTE — Progress Notes (Signed)
[facility]  - [place] [facility]   PATIENT NAME: Thomas Nicholson    MR#:  [id number]  DATE OF BIRTH:  1933/04/07  SUBJECTIVE:   Patient appears frail and weak very fatigued and deconditioning patient is very deconditioned.  Patient uses oxygen chronically 2-2 1/2 L at home. Currently he is on 3.5 L.  [name] in the room REVIEW OF SYSTEMS:   Review of Systems  Constitutional: Negative for chills, fever and weight loss.  HENT: Negative for ear discharge, ear pain and nosebleeds.   Eyes: Negative for blurred vision, pain and discharge.  Respiratory: Positive for cough and shortness of breath. Negative for sputum production, wheezing and stridor.   Cardiovascular: Negative for chest pain and palpitations.  Gastrointestinal: Negative for abdominal pain, diarrhea, nausea and vomiting.  Genitourinary: Negative for frequency and urgency.  Musculoskeletal: Negative for back pain and joint pain.  Neurological: Positive for weakness. Negative for sensory change, speech change and focal weakness.  Psychiatric/Behavioral: Negative for depression and hallucinations. The patient is nervous/anxious.    Tolerating Diet:yes Tolerating PT: rehab  DRUG ALLERGIES:  No Known Allergies  VITALS:  Blood pressure 94/72, pulse 73, temperature 97.7 F (36.5 C), resp. rate 17, height 5\' 7"  (1.702 m), weight 46.3 kg, SpO2 100 %.  PHYSICAL EXAMINATION:   Physical Exam  GENERAL:  86 y.o.-year-old patient lying in the bed with all to moderate respiratory acute distress. Thin cachectic chronically ill deconditioned EYES: Pupils equal, round, reactive to light and accommodation. No scleral icterus.   HEENT: Head atraumatic, normocephalic. Oropharynx and nasopharynx clear.  NECK:  Supple, no jugular venous distention. No thyroid enlargement, no tenderness.  LUNGS: decreased and shallow breath sounds bilaterally, no wheezing, rales, rhonchi. No use of accessory muscles of  respiration. emphysematous chest, purse lip breathing. CARDIOVASCULAR: S1, S2 normal. No murmurs, rubs, or gallops. Tachycardia + ABDOMEN: Soft, nontender, nondistended. Bowel sounds present. No organomegaly or mass.  EXTREMITIES: No cyanosis, clubbing or edema b/l.   Right hip surgical dressing-- serosangiunous discharge NEUROLOGIC: very weak and deconditioned. Grossly nonfocal.  PSYCHIATRIC:  patient is alert and oriented x 2.  SKIN: No obvious rash, lesion, or ulcer.per RN  LABORATORY PANEL:  CBC Recent Labs  Lab 08/20/19 0519  WBC 9.0  HGB 11.4*  HCT 36.4*  PLT 96*    Chemistries  Recent Labs  Lab 08/21/19 0542  NA 141  K 4.5  CL 102  CO2 32  GLUCOSE 144*  BUN 20  CREATININE 0.67  CALCIUM 10.0  AST 19  ALT 18  ALKPHOS 54  BILITOT 0.9   Cardiac Enzymes No results for input(s): TROPONINI in the last 168 hours. RADIOLOGY:  No results found. ASSESSMENT AND PLAN:  Thomas Mccamy Fergusonis a 86 y.o.malewith medical history significant forCOPD on home O2 at 2 L and rheumatoid arthritis brought in following fall onto his right side while walking his dog. He hit his head on the door but did not lose consciousness. Because of tachycardia and tachypnea he had a CTA chest that was negative for PE but showed right middle lobe pneumonia as well as a right upper lobe pulmonary nodule and several other nonacute findings.  Closed fracture of femur, intertrochanteric, right, initial encounter (HCC) Accidental fall at home, initial encounter -s/p  right hip trochanteric femoral nail. -Pain control -S/p arterial ligation of Profunda branch by Vascular surgery Dr Doris Cheadle post op - received prbc transfusion. hemodynamic stable.  -hgb stable -patient did receive one unit blood transfusion. -  Patient has right hip incision site serosanginous discharge+ no cellulitis -no fever, wbc 9.0 -discussed with Dr. Harlow Mares on the phone on 3/26 /2021. Recommends doxycycline for seven  days.  Acute on chronic respiratory failure with hypoxia and hypercapnia (HCC) Right middle lobe pneumonia/community-acquired versus aspiration with mucus plugging noted on CT chest COPD with acute exacerbation (HCC) -CTA chest was negative for PE but showed right middle lobe pneumonia as well as a right upper lobe pulmonary nodule and several other nonacute findings. -IV Rocephin and azithromycin-- change to oral antibiotics -- completed treatment -Scheduled and as needed duo nebs  -covid negative on admission -Supplemental oxygen to keep sats over 90% -change duo nebs to PRN to avoid tachycardia -patient desaturate very easily. PRN Xanax for anxiety  Acute diastolic congestive heart failure -with increasing shortness of breath BNP was checked elevated to 2800 -IV Lasix 20 mg TID-- for good urine output--change to po lasix -Echo reviewed  Paroxysmal atrial tachycardia setting of shortness of breath and anxiety -by cardiology continue metoprolol as needed  Elevated troponin -Troponin was 36>>69suspect related to demand ischemia EF 50-55%.  -Appreciate cardiology input -Patient denies chest pain and has no EKG changes  BPHwith LUTS and acute urinary retention s/p foley placement -Continue tamsulosin -- Foley catheter removed  Pulmonary nodule -CT chest in 3 months recommended by radiologist  Rheumatoid arthritis -Continue sulfasalazine  overall multiple comorbidities with given poor lung condition overall poor prognosis palliative care consulted and appreciate input. Patient is at a high risk for continued decline and cardiorespiratory arrest.  Discussed with patient's friend [name] --he understands patient has a poor prognosis.   DVT prophylaxis: SCD-- holding due ot increase incision drainage per Dr Harlow Mares rec Code Status: DNR-- after discussing with palliative care on 08/22/2019 Family Communication:  spoke with friend [name] (only point of  contact) Disposition Plan:  patient lives at home by himself.  Barrier: snf pending. TOC says patients authorization expired and will not be able to go until Monday new authorization needs to be obtained  TOTAL TIME TAKING CARE OF THIS PATIENT: *25* minutes.  >50% time spent on counselling and coordination of care  Note: This dictation was prepared with Dragon dictation along with smaller phrase technology. Any transcriptional errors that result from this process are unintentional.  Fritzi Mandes M.D    [facility]   CC: Primary care physician; Patient, No Pcp PerPatient ID: Darnelle Going, male   DOB: 1933-04-25, 86 y.o.   MRN: [id number]

## 2019-08-23 NOTE — Progress Notes (Signed)
Physical Therapy Treatment Patient Details Name: Thomas Nicholson MRN: [id number] DOB: [birth year] Today's Date: 08/23/2019    History of Present Illness Thomas Nicholson is an 86yoM who comes to [facility] on 3[facility] after a fall onto his Right side c subsequent Rt hip pain. Thomas Nicholson trying to open a door while walking the dog, hit head upon falling without LOC. Pt also noted to have PNA. Pt now s/p ORIF of hip, WBAT per Dr. Odis Luster. PMH: COPD on home O2 at 2 L and rheumatoid arthritis. Pt was having  more difficulty with O2 sats and sinus tachycardia on POD3, noted to also have elevated BNP.    PT Comments    Pt was long sitting in bed upon arriving, He agrees to PT session. At rest, on 3 L o2 but did require increase to 4 L during transfer and OOB activity. Pt does follow commands consistently and was cooperative throughout. He is motivated to get better to see his dog. Pt was able to get out of R side of bed with increased time and Mod assist. Sat EOB x several minutes to catch his breath. Pt does fatigue quickly. Sao2 de-sats to 84% and required increase to 4 L to maintain 90% or better. He was able to safely stand with min assist and take 5 steps to recliner. Very fatigued and required prolong rest. He tolerated standing there ex at EOB well. Therapist will return later this date to trial increasing gait distances. Overall pt is progressing with PT but SNF recommended to address deficits with strength, endurance, and safe functional mobility.      Follow Up Recommendations  SNF;Supervision for mobility/OOB     Equipment Recommendations  Rolling walker with 5" wheels    Recommendations for Other Services       Precautions / Restrictions Precautions Precautions: Fall Restrictions Weight Bearing Restrictions: Yes RLE Weight Bearing: Weight bearing as tolerated    Mobility  Bed Mobility Overal bed mobility: Needs Assistance Bed Mobility: Supine to Sit     Supine to sit: Mod assist      General bed mobility comments: Mod assist + increased time to progress to R side EOB. vcs throughout for technique and sequencing improvements.  Transfers Overall transfer level: Needs assistance Equipment used: Rolling walker (2 wheeled) Transfers: Sit to/from Stand Sit to Stand: Min assist;From elevated surface         General transfer comment: pt was able to STS 3 x for ~ 1-2 minute each trial standing while performing standing ther ex.  Ambulation/Gait Ambulation/Gait assistance: Min assist Gait Distance (Feet): 5 Feet Assistive device: Rolling walker (2 wheeled) Gait Pattern/deviations: Step-to pattern Gait velocity: decreased   General Gait Details: pt was able to take ~ 5 steps to recliner however does have desat to  84% on 3 L. Increased O2 to 4 L and informed RN   Stairs             Wheelchair Mobility    Modified Rankin (Stroke Patients Only)       Balance Overall balance assessment: Needs assistance Sitting-balance support: Feet supported Sitting balance-Leahy Scale: Fair Sitting balance - Comments: appears weak, but is able to remain upright with trunk control/kyphosis.   Standing balance support: Bilateral upper extremity supported;During functional activity Standing balance-Leahy Scale: Fair Standing balance comment: fatiges quickly while standing                            Cognition  Arousal/Alertness: Awake/alert Behavior During Therapy: WFL for tasks assessed/performed;Flat affect Overall Cognitive Status: Within Functional Limits for tasks assessed Area of Impairment: Following commands;Safety/judgement;Awareness;Problem solving                   Current Attention Level: Sustained Memory: Decreased recall of precautions;Decreased short-term memory Following Commands: Follows one step commands consistently Safety/Judgement: Decreased awareness of safety;Decreased awareness of deficits   Problem Solving: Slow  processing;Requires verbal cues;Decreased initiation General Comments: Pt was A and oriented x 3. He was able to consistantly follow commands and was cooperative throughout. Pt on 3 L o2 but required increased demand so therapist increased to 4 L       Exercises      General Comments        Pertinent Vitals/Pain Pain Assessment: No/denies pain(no hip pain) Pain Intervention(s): Monitored during session;Limited activity within patient's tolerance    Home Living                      Prior Function            PT Goals (current goals can now be found in the care plan section) Acute Rehab PT Goals Patient Stated Goal: " I want to get better so I can get out of here" Progress towards PT goals: Progressing toward goals    Frequency    BID      PT Plan Current plan remains appropriate    Co-evaluation              AM-PAC PT "6 Clicks" Mobility   Outcome Measure  Help needed turning from your back to your side while in a flat bed without using bedrails?: A Lot Help needed moving from lying on your back to sitting on the side of a flat bed without using bedrails?: A Lot Help needed moving to and from a bed to a chair (including a wheelchair)?: A Lot Help needed standing up from a chair using your arms (e.g., wheelchair or bedside chair)?: A Lot Help needed to walk in hospital room?: Total Help needed climbing 3-5 steps with a railing? : Total 6 Click Score: 10    End of Session Equipment Utilized During Treatment: Gait belt;Oxygen Activity Tolerance: Patient limited by fatigue Patient left: with call bell/phone within reach;in chair;with chair alarm set Nurse Communication: Mobility status PT Visit Diagnosis: Other abnormalities of gait and mobility (R26.89);Muscle weakness (generalized) (M62.81);History of falling (Z91.81);Difficulty in walking, not elsewhere classified (R26.2) Pain - Right/Left: Right Pain - part of body: Hip     Time: 6629-4765 PT  Time Calculation (min) (ACUTE ONLY): 17 min  Charges:  $Therapeutic Activity: 8-22 mins                     Julaine Fusi PTA 08/23/19, 1:08 PM

## 2019-08-23 NOTE — Progress Notes (Signed)
Subjective: 6 Days Post-Op Procedure(s) (LRB): INTRAMEDULLARY (IM) NAIL INTERTROCHANTRIC (Right)   Patient is alert and eating lunch.  And is ready to be discharged to skilled nursing facility.  His dressing is is dry today.  Blood work is stable.  Minimal pain with range of motion of the hip today.  Patient reports pain as mild.  Objective:   VITALS:   Vitals:   08/22/19 2250 08/23/19 0713  BP: (!) 131/58 94/72  Pulse: 81 73  Resp: 18 17  Temp: 97.7 F (36.5 C) 97.7 F (36.5 C)  SpO2: 100% 100%    Neurologically intact Neurovascular intact Sensation intact distally Intact pulses distally Dorsiflexion/Plantar flexion intact Incision: no drainage  LABS No results for input(s): HGB, HCT, WBC, PLT in the last 72 hours.  Recent Labs    08/21/19 0542  NA 141  K 4.5  BUN 20  CREATININE 0.67  GLUCOSE 144*    No results for input(s): LABPT, INR in the last 72 hours.   Assessment/Plan: 6 Days Post-Op Procedure(s) (LRB): INTRAMEDULLARY (IM) NAIL INTERTROCHANTRIC (Right)   Advance diet Up with therapy Discharge to SNF   Return to clinic in 2 weeks to see Dr. Odis Luster.  ASA 81 mg twice daily for DVT prophylaxis

## 2019-08-23 NOTE — Progress Notes (Signed)
Speech Language Pathology Treatment: Dysphagia  Patient Details Name: Thomas Nicholson MRN: [id number] DOB: [birth year] Today's Date: 08/23/2019 Time: 1105-1140 SLP Time Calculation (min) (ACUTE ONLY): 35 min  Assessment / Plan / Recommendation Clinical Impression  Pt seen today for ongoing assessment of swallowing; toleration of current w/ trials to upgrade diet if appropriate. Pt lying reclined in bed; noted [employer] coffee cup 1/2 full of coffee on tray table next to pt. Pt denied coughing when drinking coffee w/ visitor earlier this morning.  Pt appears to present w/ somewhat improved oropharyngeal phase swallowing function w/ increased Cognitive attention to po tasks. He still exhibits decreased insight into safe swallowing strategies(ie lying reclined in bed drinking thin liquids), and he presents w/ generalized weakness overall. He is at increased risk for aspiration w/ oral intake.  Pt was educated on need to position himself UPRIGHT for drinking/eating of po's, especially liquids. Instructed pt on facts that any decreased attention/awareness and weakness of status during swallowing tasks can increase risk for aspiration, dysphagia thus Pulmonary impact/decline. After support to sit upright for po trials, pt consumed trials of thin liquids via Cup w/ no immediate, overt coughing noted; no decline in vocal quality or respiratory status during/post po's. Pt does have a congested cough(productive) at baseline but this was Not heard immediately/post following po intake. Pt exhibited increased awareness to need to Slow down and take Small sips = he required min verbal cues for follow through w/ tasks.  Recommend upgrade of diet to Dysphagia level 1 (puree) w/ Thin liquids via Cup = No Straws; aspiration precautions; Pills While in Puree for easier, safer swallowing. Setup support at meals; positioning upright in bed or sitting in chair. ST services will f/u w/ toleration of diet next 2-3 days.  Recommended an Ensure drink supplement for support d/t dentition status impacting eating of solid foods. NSG and MD updated, agreed.     HPI HPI: Pt is a 86 y.o. male hx of COPD on 2 L [place] at baseline, hypertension, rheumatoid arthritis, and weight loss here presenting with fall with right hip pain.  Patient states that he was walking his dog and tripped and fell onto the right hip.  He also did hit his head on the door but did not lose consciousness.  Patient was noted to be hypoxic and tachycardic on arrival.  Patient states that he is wearing oxygen and denies any worsening shortness of breath.  Patient was noted to have right hip deformity at admit.  Pt is Post-Op Procedure(s) (LRB): INTRAMEDULLARY (IM) NAIL INTERTROCHANTRIC (Right).  CXR: "Bilateral pulmonary infiltrates/edema bilateral pleural effusions; CHF; emphysema".  Previous Head CT: "Small vessel ischemic disease and parenchymal volume loss".      SLP Plan  Continue with current plan of care(MBSS TBD)       Recommendations  Diet recommendations: Dysphagia 1 (puree);Thin liquid Liquids provided via: Cup;No straw Medication Administration: Whole meds with puree(for safer swallowing) Supervision: Patient able to self feed;Intermittent supervision to cue for compensatory strategies Compensations: Minimize environmental distractions;Slow rate;Small sips/bites;Lingual sweep for clearance of pocketing;Follow solids with liquid Postural Changes and/or Swallow Maneuvers: Seated upright 90 degrees;Upright 30-60 min after meal;Out of bed for meals                General recommendations: (Dietician f/u for support) Oral Care Recommendations: Oral care BID;Oral care before and after PO;Staff/trained caregiver to provide oral care Follow up Recommendations: Skilled Nursing facility SLP Visit Diagnosis: Dysphagia, oropharyngeal phase (R13.12) Plan: Continue with current plan  of care(MBSS TBD)       GO                Thomas Som, MS, CCC-SLP Thomas Nicholson 08/23/2019, 12:37 PM

## 2019-08-23 NOTE — TOC Progression Note (Signed)
Transition of Care [facility]) - Progression Note    Patient Details  Name: Thomas Nicholson MRN: [id number] Date of Birth: 1932/10/27  [facility]) CM/SW Contact  Maud Deed, LCSW Phone Number:[phone] 08/23/2019, 11:19 AM  Clinical Narrative:    CSW called [employer] to see if Auth had been approved, Found out that his auth expired on 08/22/19. CSW started new authorization Ref number [id number] and Faxed clinicals.  TOC will continue to follow for discharge planning needs.    Expected Discharge Plan: Skilled Nursing Facility Barriers to Discharge: Continued Medical Work up  Expected Discharge Plan and Services Expected Discharge Plan: Skilled Nursing Facility   Discharge Planning Services: CM Consult   Living arrangements for the past 2 months: Single Family Home                 DME Arranged: N/A         HH Arranged: NA           Social Determinants of Health (SDOH) Interventions    Readmission Risk Interventions No flowsheet data found.

## 2019-08-24 LAB — RESPIRATORY PANEL BY RT PCR (FLU A&B, COVID)
Influenza A by PCR: NEGATIVE
Influenza B by PCR: NEGATIVE
SARS Coronavirus 2 by RT PCR: NEGATIVE

## 2019-08-24 MED ORDER — DOXYCYCLINE HYCLATE 100 MG PO TABS: 100.0000 mg | ORAL_TABLET | Freq: Two times a day (BID) | ORAL | 0 refills | Status: AC

## 2019-08-24 MED ORDER — ASPIRIN 81 MG PO TBEC: 81.0000 mg | DELAYED_RELEASE_TABLET | Freq: Every day | ORAL | 1 refills | Status: AC

## 2019-08-24 MED ORDER — HYDROCODONE-ACETAMINOPHEN 5-325 MG PO TABS: 1.0000 | ORAL_TABLET | Freq: Four times a day (QID) | ORAL | 0 refills | Status: AC | PRN

## 2019-08-24 MED ORDER — ALPRAZOLAM 0.25 MG PO TABS: 0.2500 mg | ORAL_TABLET | Freq: Two times a day (BID) | ORAL | 0 refills | Status: AC | PRN

## 2019-08-24 MED ORDER — ASPIRIN EC 81 MG PO TBEC
81.0000 mg | DELAYED_RELEASE_TABLET | Freq: Every day | ORAL | Status: DC
  Administered 2019-08-24: 81 mg via ORAL
  Filled 2019-08-24: qty 1

## 2019-08-24 MED ORDER — FUROSEMIDE 40 MG PO TABS: 20.0000 mg | ORAL_TABLET | ORAL | 0 refills | Status: AC

## 2019-08-24 MED ORDER — DOCUSATE SODIUM 100 MG PO CAPS: 100.0000 mg | ORAL_CAPSULE | Freq: Two times a day (BID) | ORAL | 0 refills | Status: AC

## 2019-08-24 MED ORDER — ADULT MULTIVITAMIN W/MINERALS CH: 1.0000 | ORAL_TABLET | Freq: Every day | ORAL | 0 refills | Status: AC

## 2019-08-24 MED ORDER — BISOPROLOL FUMARATE 5 MG PO TABS: 5.0000 mg | ORAL_TABLET | Freq: Every day | ORAL | 0 refills | Status: AC

## 2019-08-24 MED ORDER — ACETAMINOPHEN 325 MG PO TABS: 650.0000 mg | ORAL_TABLET | Freq: Four times a day (QID) | ORAL | Status: DC | PRN

## 2019-08-24 MED ORDER — ENSURE ENLIVE PO LIQD: 237.0000 mL | Freq: Three times a day (TID) | ORAL | 12 refills | Status: AC

## 2019-08-24 NOTE — Discharge Summary (Signed)
[facility] [place] [facility]   PATIENT NAME: Thomas Nicholson    MR#:  [id number]  DATE OF BIRTH:  [birth year]  DATE OF ADMISSION:  08/16/2019 ADMITTING PHYSICIAN: Andris Baumann, MD  DATE OF DISCHARGE: 08/24/2019  PRIMARY CARE PHYSICIAN: Barbette Reichmann, MD    ADMISSION DIAGNOSIS:  Closed right hip fracture (HCC) [S72.001A] Closed fracture of right hip, initial encounter (HCC) [S72.001A] Community acquired pneumonia of right lower lobe of lung [J18.9]  DISCHARGE DIAGNOSIS:  Close right Intertrochanteric fracture status post surgery Acute on chronic hypoxic/ hypercapneic Respiratory failure secondary to COPD exacerbation Right middle lobe pneumonia-- completed antibiotic Acute diastolic congestive heart failure Paroxysmal atrial tachycardia in the setting of COPD exacerbation and anxiety  SECONDARY DIAGNOSIS:   Past Medical History:  Diagnosis Date  . COPD (chronic obstructive pulmonary disease) (HCC)   . Rheumatoid arthritis [facility])     HOSPITAL COURSE:   Chares Slaymaker Fergusonis a 86 y.o.malewith medical history significant forCOPD on home O2 at 2 L and rheumatoid arthritis brought in following fall onto his right side while walking his dog. He hit his head on the door but did not lose consciousness.  Closed fracture of femur, intertrochanteric, right, initial encounter (HCC) Accidental fall at home, initial encounter -s/p right hip trochanteric femoral nail by Dr Odis Luster -Pain control -S/p arterial ligation of Profunda branch by Vascular surgery Dr Myra Gianotti -post op - received prbc transfusion.   -hgb stable -patient did receive one unit blood transfusion. -Patient has right hip incision site serosanginous discharge+ no cellulitis -no fever, wbc 9.0 -discussed with Dr. Odis Luster on the phone on 3/26 /2021. Recommends doxycycline for seven days.  Acute on chronic respiratory failure with hypoxia and hypercapnia (HCC) Right middle lobe  pneumonia/community-acquired versus aspiration with mucus plugging noted on CT chest COPD with acute exacerbation (HCC) -CTA chest was negative for PE but showed right middle lobe pneumonia as well as a right upper lobe pulmonary nodule and several other nonacute findings. -IV Rocephin and azithromycin-- change to oral antibiotics -- completed treatment -inhalers and incentive spirometer -covid negative on admission -Supplemental oxygen to keep sats over 90% -patient desaturate very easily. PRN Xanax for anxiety  Acute diastolic congestive heart failure -with increasing shortness of breath BNP was checked elevated to 2800 -IV Lasix 20 mg TID-- for good urine output--change to po lasix 20 mg qod -Echo reviewed -seen by [facility] cardiology  Paroxysmal atrial tachycardia setting of shortness of breath and anxiety -by cardiology continue bisoprolol  Elevated troponin -Troponin was 36>>69suspect related to demand ischemia with COPD and CHF EF 50-55%.  -Appreciate cardiology input  BPHwith LUTS and acute urinary retention s/p foley placement -Continue tamsulosin -- Foley catheter removed--good UOP  Pulmonary nodule -CT chest in 3 months recommended by radiologist  Rheumatoid arthritis -Continue sulfasalazine, prednisone  overall multiple comorbidities with given poor lung condition overall poor prognosis palliative care consulted and appreciate input. Patient is at a high risk for continued decline and cardiorespiratory arrest and high risk for re-admission Discussed with patient's friend Jorene Minors --he understands patient has a poor long term prognosis.   DVT prophylaxis:ASA and SCD --no antplt  due ot increase incision drainage per Dr Odis Luster rec Code Status: DNR Family Communication: spoke with friend Jorene Minors (only point of contact) Disposition Plan: patient lives at home by himself.  TO [facility] after repeat COVID obtained CONSULTS OBTAINED:  Treatment Team:   Vanna Scotland, MD Lyndle Herrlich, MD  DRUG ALLERGIES:  No Known Allergies  DISCHARGE MEDICATIONS:   Allergies as of 08/24/2019   No Known Allergies     Medication List    TAKE these medications   albuterol 108 (90 Base) MCG/ACT inhaler Commonly known as: VENTOLIN HFA Inhale 2 puffs into the lungs every 6 (six) hours as needed for wheezing or shortness of breath.   alendronate 70 MG tablet Commonly known as: FOSAMAX Take 70 mg by mouth every Friday.   ALPRAZolam 0.25 MG tablet Commonly known as: XANAX Take 1 tablet (0.25 mg total) by mouth 2 (two) times daily as needed for anxiety.   bisoprolol 5 MG tablet Commonly known as: ZEBETA Take 1 tablet (5 mg total) by mouth daily.   cyanocobalamin 1000 MCG tablet Take 1,000 mcg by mouth daily.   docusate sodium 100 MG capsule Commonly known as: COLACE Take 1 capsule (100 mg total) by mouth 2 (two) times daily.   doxycycline 100 MG tablet Commonly known as: VIBRA-TABS Take 1 tablet (100 mg total) by mouth every 12 (twelve) hours for 5 days.   feeding supplement (ENSURE ENLIVE) Liqd Take 237 mLs by mouth 3 (three) times daily between meals.   folic acid 1 MG tablet Commonly known as: FOLVITE Take 1 mg by mouth daily.   furosemide 40 MG tablet Commonly known as: LASIX Take 0.5 tablets (20 mg total) by mouth every other day.   HYDROcodone-acetaminophen 5-325 MG tablet Commonly known as: NORCO/VICODIN Take 1 tablet by mouth every 6 (six) hours as needed for moderate pain. What changed:   when to take this  reasons to take this   multivitamin with minerals Tabs tablet Take 1 tablet by mouth daily.   omeprazole 20 MG capsule Commonly known as: PRILOSEC Take 20 mg by mouth daily.   predniSONE 5 MG tablet Commonly known as: DELTASONE Take 5 mg by mouth daily.   sulfaSALAzine 500 MG tablet Commonly known as: AZULFIDINE Take 1,000 mg by mouth 2 (two) times daily.   tamsulosin 0.4 MG Caps capsule Commonly  known as: FLOMAX Take 0.4 mg by mouth daily.   Wixela Inhub 250-50 MCG/DOSE Aepb Generic drug: Fluticasone-Salmeterol Inhale 1 puff into the lungs 2 (two) times daily.       If you experience worsening of your admission symptoms, develop shortness of breath, life threatening emergency, suicidal or homicidal thoughts you must seek medical attention immediately by calling 911 or calling your MD immediately  if symptoms less severe.  You Must read complete instructions/literature along with all the possible adverse reactions/side effects for all the Medicines you take and that have been prescribed to you. Take any new Medicines after you have completely understood and accept all the possible adverse reactions/side effects.   Please note  You were cared for by a hospitalist during your hospital stay. If you have any questions about your discharge medications or the care you received while you were in the hospital after you are discharged, you can call the unit and asked to speak with the hospitalist on call if the hospitalist that took care of you is not available. Once you are discharged, your primary care physician will handle any further medical issues. Please note that NO REFILLS for any discharge medications will be authorized once you are discharged, as it is imperative that you return to your primary care physician (or establish a relationship with a primary care physician if you do not have one) for your aftercare needs so that they can reassess your need for medications and monitor your lab  values. Today   SUBJECTIVE   Doing ok. No new issues  VITAL SIGNS:  Blood pressure 131/62, pulse 60, temperature (!) 97.4 F (36.3 C), temperature source Oral, resp. rate 18, height 5\' 7"  (1.702 m), weight 46.3 kg, SpO2 100 %.  I/O:  No intake or output data in the 24 hours ending 08/24/19 0916  PHYSICAL EXAMINATION:   GENERAL:  86 y.o.-year-old patient lying in the bed with all to moderate  respiratory acute distress. Thin cachectic chronically ill deconditioned EYES: Pupils equal, round, reactive to light and accommodation. No scleral icterus.   HEENT: Head atraumatic, normocephalic. Oropharynx and nasopharynx clear.  NECK:  Supple, no jugular venous distention. No thyroid enlargement, no tenderness.  LUNGS: decreased and shallow breath sounds bilaterally, no wheezing, rales, rhonchi. No use of accessory muscles of respiration. emphysematous chest, purse lip breathing. CARDIOVASCULAR: S1, S2 normal. No murmurs, rubs, or gallops. Tachycardia + ABDOMEN: Soft, nontender, nondistended. Bowel sounds present. No organomegaly or mass.  EXTREMITIES: No cyanosis, clubbing or edema b/l.   Right hip surgical dressing-- serosangiunous discharge + no cellultis NEUROLOGIC:  weak and deconditioned. Grossly nonfocal.  PSYCHIATRIC:  patient is alert and oriented x 2.  SKIN: No obvious rash, lesion, or ulcer.per RN DATA REVIEW:   CBC  Recent Labs  Lab 08/20/19 0519  WBC 9.0  HGB 11.4*  HCT 36.4*  PLT 96*    Chemistries  Recent Labs  Lab 08/21/19 0542  NA 141  K 4.5  CL 102  CO2 32  GLUCOSE 144*  BUN 20  CREATININE 0.67  CALCIUM 10.0  AST 19  ALT 18  ALKPHOS 54  BILITOT 0.9    Microbiology Results   Recent Results (from the past 240 hour(s))  Respiratory Panel by RT PCR (Flu A&B, Covid) - Nasopharyngeal Swab     Status: None   Collection Time: 08/16/19 10:22 PM   Specimen: Nasopharyngeal Swab  Result Value Ref Range Status   SARS Coronavirus 2 by RT PCR NEGATIVE NEGATIVE Final    Comment: (NOTE) SARS-CoV-2 target nucleic acids are NOT DETECTED. The SARS-CoV-2 RNA is generally detectable in upper respiratoy specimens during the acute phase of infection. The lowest concentration of SARS-CoV-2 viral copies this assay can detect is 131 copies/mL. A negative result does not preclude SARS-Cov-2 infection and should not be used as the sole basis for treatment or other  patient management decisions. A negative result may occur with  improper specimen collection/handling, submission of specimen other than nasopharyngeal swab, presence of viral mutation(s) within the areas targeted by this assay, and inadequate number of viral copies (<131 copies/mL). A negative result must be combined with clinical observations, patient history, and epidemiological information. The expected result is Negative. Fact Sheet for Patients:  08/18/19 Fact Sheet for Healthcare Providers:  https://www.moore.com/ This test is not yet ap proved or cleared by the https://www.young.biz/ FDA and  has been authorized for detection and/or diagnosis of SARS-CoV-2 by FDA under an Emergency Use Authorization (EUA). This EUA will remain  in effect (meaning this test can be used) for the duration of the COVID-19 declaration under Section 564(b)(1) of the Act, 21 U.S.C. section 360bbb-3(b)(1), unless the authorization is terminated or revoked sooner.    Influenza A by PCR NEGATIVE NEGATIVE Final   Influenza B by PCR NEGATIVE NEGATIVE Final    Comment: (NOTE) The Xpert Xpress SARS-CoV-2/FLU/RSV assay is intended as an aid in  the diagnosis of influenza from Nasopharyngeal swab specimens and  should not be used  as a sole basis for treatment. Nasal washings and  aspirates are unacceptable for Xpert Xpress SARS-CoV-2/FLU/RSV  testing. Fact Sheet for Patients: https://www.moore.com/ Fact Sheet for Healthcare Providers: https://www.young.biz/ This test is not yet approved or cleared by the [place] FDA and  has been authorized for detection and/or diagnosis of SARS-CoV-2 by  FDA under an Emergency Use Authorization (EUA). This EUA will remain  in effect (meaning this test can be used) for the duration of the  Covid-19 declaration under Section 564(b)(1) of the Act, 21  U.S.C. section 360bbb-3(b)(1), unless  the authorization is  terminated or revoked. Performed at [facility], [address] [place], [place] [address]   Blood culture (routine x 2)     Status: None   Collection Time: 08/17/19 12:03 AM   Specimen: BLOOD  Result Value Ref Range Status   Specimen Description BLOOD BLOOD LEFT FOREARM  Final   Special Requests   Final    BOTTLES DRAWN AEROBIC AND ANAEROBIC Blood Culture adequate volume   Culture   Final    NO GROWTH 5 DAYS Performed at [facility], [address], [place], [place] [address]    Report Status 08/22/2019 FINAL  Final  Blood culture (routine x 2)     Status: None   Collection Time: 08/17/19 12:03 AM   Specimen: BLOOD  Result Value Ref Range Status   Specimen Description BLOOD LEFT ANTECUBITAL  Final   Special Requests   Final    BOTTLES DRAWN AEROBIC AND ANAEROBIC Blood Culture adequate volume   Culture   Final    NO GROWTH 5 DAYS Performed at [facility], [address], [place], [place] [address]    Report Status 08/22/2019 FINAL  Final  Surgical PCR screen     Status: None   Collection Time: 08/17/19  1:31 PM   Specimen: Nasal Mucosa; Nasal Swab  Result Value Ref Range Status   MRSA, PCR NEGATIVE NEGATIVE Final   Staphylococcus aureus NEGATIVE NEGATIVE Final    Comment: (NOTE) The Xpert SA Assay (FDA approved for NASAL specimens in patients 17 years of age and older), is one component of a comprehensive surveillance program. It is not intended to diagnose infection nor to guide or monitor treatment. Performed at [facility], [address] [place], [place] [address]     RADIOLOGY:  No results found.   CODE STATUS:     Code Status Orders  (From admission, onward)         Start     Ordered   08/21/19 1320  Do not attempt resuscitation (DNR)  Continuous    Question Answer Comment  In the event of cardiac or respiratory ARREST Do not call a "code blue"   In the event of cardiac or  respiratory ARREST Do not perform Intubation, CPR, defibrillation or ACLS   In the event of cardiac or respiratory ARREST Use medication by any route, position, wound care, and other measures to relive pain and suffering. May use oxygen, suction and manual treatment of airway obstruction as needed for comfort.      08/21/19 1319        Code Status History    Date Active Date Inactive Code Status Order ID Comments User Context   08/16/2019 2319 08/21/2019 1319 Full Code [id number]  Andris Baumann, MD ED   Advance Care Planning Activity       TOTAL TIME TAKING CARE OF THIS PATIENT: *40* minutes.  Fritzi Mandes M.D  [facility]    CC: Primary care physician; Tracie Harrier, MD

## 2019-08-24 NOTE — TOC Progression Note (Deleted)
Transition of Care [facility]) - Progression Note    Patient Details  Name: Thomas Nicholson MRN: [id number] Date of Birth: [birth year]  Transition [facility] Care [facility]) CM/SW Contact  Maud Deed, LCSW Phone Number:[phone] 08/24/2019, 10:57 AM  Clinical Narrative:    Pt and family wanted him to come to the [facility] but when CSW notified family of discharge they were in disagreement and wanted to speak with the MD and saying that he can't walk so he can't come home. MD spoke to the family and cancelled discharge.  TOC will continue to follow for discharge planning needs.   Expected Discharge Plan: Skilled Nursing Facility Barriers to Discharge: No Barriers Identified  Expected Discharge Plan and Services Expected Discharge Plan: Skilled Nursing Facility   Discharge Planning Services: CM Consult   Living arrangements for the past 2 months: Single Family Home Expected Discharge Date: 08/24/19               DME Arranged: N/A         HH Arranged: NA           Social Determinants of Health (SDOH) Interventions    Readmission Risk Interventions No flowsheet data found.

## 2019-08-24 NOTE — Progress Notes (Signed)
Report called to Ardine Eng at [facility]. Patient is going to room 27A and being transported via EMS. NT will prepare patient for transport, pack belongings and remove patients IV.

## 2019-08-24 NOTE — Progress Notes (Signed)
Physical Therapy Treatment Patient Details Name: Thomas Nicholson MRN: [id number] DOB: 1932-09-25 Today's Date: 08/24/2019    History of Present Illness Thomas Nicholson is an 86yoM who comes to [facility] on 3/20 after a fall onto his Right side c subsequent Rt hip pain. Larey Seat trying to open a door while walking the dog, hit head upon falling without LOC. Pt also noted to have PNA. Pt now s/p ORIF of hip, WBAT per Dr. Odis Luster. PMH: COPD on home O2 at 2 L and rheumatoid arthritis. Pt was having  more difficulty with O2 sats and sinus tachycardia on POD3, noted to also have elevated BNP.    PT Comments    Patient agrees to PT treatment. He has difficulty with bed mobiltiy needing mod assist for supine <> sit . He needs the bed elevated for sit to stand transfer and needs mod assist with cues for safety. He has poor standing tolerance and poor standing posture with trunk leaning over the front of the RW. He is able to correct his posture but then is not able to stand long enough to begin taking any steps. He has uncontrolled sit to bed from standing. He needs max assist to get positioned in bed. He will continue to benefit from skilled PT to improve mobility and strength.   Follow Up Recommendations  SNF     Equipment Recommendations  Rolling walker with 5" wheels    Recommendations for Other Services       Precautions / Restrictions Precautions Precautions: Fall Restrictions Weight Bearing Restrictions: Yes RLE Weight Bearing: Weight bearing as tolerated    Mobility  Bed Mobility Overal bed mobility: Needs Assistance Bed Mobility: Supine to Sit     Supine to sit: Mod assist     General bed mobility comments: Mod assist + increased time to progress to R side EOB. vcs throughout for technique and sequencing improvements.  Transfers Overall transfer level: Needs assistance Equipment used: Rolling walker (2 wheeled) Transfers: Sit to/from Stand Sit to Stand: Min assist;From elevated  surface         General transfer comment: (poor trunk control and leaning over the top of the RW)  Ambulation/Gait Ambulation/Gait assistance: (unable/ patient not able to stand long enough to take a step)               Stairs             Wheelchair Mobility    Modified Rankin (Stroke Patients Only)       Balance Overall balance assessment: Needs assistance Sitting-balance support: Feet supported Sitting balance-Leahy Scale: Fair Sitting balance - Comments: appears weak, but is able to remain upright with trunk control/kyphosis.   Standing balance support: Bilateral upper extremity supported;During functional activity Standing balance-Leahy Scale: Fair Standing balance comment: fatiges quickly while standing                            Cognition Arousal/Alertness: Awake/alert Behavior During Therapy: WFL for tasks assessed/performed;Flat affect Overall Cognitive Status: Within Functional Limits for tasks assessed Area of Impairment: Following commands;Safety/judgement;Awareness;Problem solving                   Current Attention Level: Sustained Memory: Decreased recall of precautions;Decreased short-term memory Following Commands: Follows one step commands consistently Safety/Judgement: Decreased awareness of safety;Decreased awareness of deficits   Problem Solving: Slow processing;Requires verbal cues;Decreased initiation General Comments: Pt was A and oriented x 3. He was able  to consistantly follow commands and was cooperative throughout. Pt on 3 L o2 but required increased demand so therapist increased to 4 L       Exercises      General Comments        Pertinent Vitals/Pain Pain Assessment: No/denies pain Pain Intervention(s): Limited activity within patient's tolerance    Home Living                      Prior Function            PT Goals (current goals can now be found in the care plan section) Acute Rehab  PT Goals Patient Stated Goal: " I want to get better so I can get out of here" Progress towards PT goals: Progressing toward goals    Frequency    BID      PT Plan Current plan remains appropriate    Co-evaluation              AM-PAC PT "6 Clicks" Mobility   Outcome Measure  Help needed turning from your back to your side while in a flat bed without using bedrails?: A Lot Help needed moving from lying on your back to sitting on the side of a flat bed without using bedrails?: A Lot Help needed moving to and from a bed to a chair (including a wheelchair)?: A Lot Help needed standing up from a chair using your arms (e.g., wheelchair or bedside chair)?: A Lot Help needed to walk in hospital room?: A Lot Help needed climbing 3-5 steps with a railing? : A Lot 6 Click Score: 12    End of Session Equipment Utilized During Treatment: Gait belt;Oxygen Activity Tolerance: Patient limited by fatigue Patient left: with call bell/phone within reach;in chair;with chair alarm set Nurse Communication: Mobility status PT Visit Diagnosis: Unsteadiness on feet (R26.81);Muscle weakness (generalized) (M62.81);Difficulty in walking, not elsewhere classified (R26.2) Pain - Right/Left: Right Pain - part of body: Hip     Time: 1105-1130 PT Time Calculation (min) (ACUTE ONLY): 25 min  Charges:  $Therapeutic Activity: 23-37 mins                        Alanson Puls, PT DPT 08/24/2019, 11:49 AM

## 2019-08-24 NOTE — Discharge Instructions (Signed)
Continuous oxygen to keep sats greater than 89- 90%

## 2019-08-24 NOTE — Progress Notes (Signed)
EMS called

## 2019-08-24 NOTE — TOC Transition Note (Addendum)
Transition of Care [facility]) - CM/SW Discharge Note   Patient Details  Name: Thomas Nicholson MRN: [id number] Date of Birth: 06-Nov-1932  Transition of Care [facility]) CM/SW Contact:  Maud Deed, LCSW Phone [phone] 08/24/2019, 10:03 AM   Clinical Narrative:    Pt medically stabel for discharge per MD. Pt will be transported to [facility] via EMS. Nurse stated that she will arrange EMS transport. CSW notified pt's contact of discharge. Pt will be in room 27A call to report number is [phone]   Final next level of care: Skilled Nursing Facility Barriers to Discharge: No Barriers Identified   Patient Goals and CMS Choice        Discharge Placement                Patient to be transferred to facility by: EMS Name of family member notified: Alinda Money Patient and family notified of of transfer: 08/24/19  Discharge Plan and Services   Discharge Planning Services: CM Consult            DME Arranged: N/A         HH Arranged: NA          Social Determinants of Health (SDOH) Interventions     Readmission Risk Interventions No flowsheet data found.

## 2019-08-28 DEATH — deceased
